# Patient Record
Sex: Male | Born: 1935 | Race: White | Hispanic: No | Smoking: Never smoker
Health system: Southern US, Community
[De-identification: ages and names within clinical notes are randomized; demographics above are authoritative.]

## PROBLEM LIST (undated history)

## (undated) DIAGNOSIS — I219 Acute myocardial infarction, unspecified: Secondary | ICD-10-CM

## (undated) DIAGNOSIS — I499 Cardiac arrhythmia, unspecified: Secondary | ICD-10-CM

## (undated) DIAGNOSIS — I10 Essential (primary) hypertension: Secondary | ICD-10-CM

## (undated) DIAGNOSIS — I2510 Atherosclerotic heart disease of native coronary artery without angina pectoris: Secondary | ICD-10-CM

## (undated) DIAGNOSIS — N183 Chronic kidney disease, stage 3 unspecified: Secondary | ICD-10-CM

## (undated) DIAGNOSIS — Z5189 Encounter for other specified aftercare: Secondary | ICD-10-CM

## (undated) DIAGNOSIS — J449 Chronic obstructive pulmonary disease, unspecified: Secondary | ICD-10-CM

## (undated) DIAGNOSIS — E039 Hypothyroidism, unspecified: Secondary | ICD-10-CM

## (undated) DIAGNOSIS — F32A Depression, unspecified: Secondary | ICD-10-CM

## (undated) DIAGNOSIS — IMO0002 Reserved for concepts with insufficient information to code with codable children: Secondary | ICD-10-CM

## (undated) DIAGNOSIS — I509 Heart failure, unspecified: Secondary | ICD-10-CM

## (undated) DIAGNOSIS — E669 Obesity, unspecified: Secondary | ICD-10-CM

## (undated) DIAGNOSIS — F329 Major depressive disorder, single episode, unspecified: Secondary | ICD-10-CM

## (undated) DIAGNOSIS — G4733 Obstructive sleep apnea (adult) (pediatric): Secondary | ICD-10-CM

## (undated) DIAGNOSIS — G4736 Sleep related hypoventilation in conditions classified elsewhere: Secondary | ICD-10-CM

## (undated) DIAGNOSIS — M1990 Unspecified osteoarthritis, unspecified site: Secondary | ICD-10-CM

## (undated) DIAGNOSIS — IMO0001 Reserved for inherently not codable concepts without codable children: Secondary | ICD-10-CM

## (undated) HISTORY — PX: JOINT REPLACEMENT: SHX530

## (undated) HISTORY — PX: KNEE SURGERY: SHX244

## (undated) HISTORY — DX: Atherosclerotic heart disease of native coronary artery without angina pectoris: I25.10

## (undated) HISTORY — PX: EYE SURGERY: SHX253

## (undated) HISTORY — PX: BACK SURGERY: SHX140

## (undated) HISTORY — DX: Essential (primary) hypertension: I10

## (undated) HISTORY — PX: CORONARY ARTERY BYPASS GRAFT: SHX141

## (undated) HISTORY — DX: Reserved for inherently not codable concepts without codable children: IMO0001

## (undated) HISTORY — PX: CARDIAC SURGERY: SHX584

## (undated) HISTORY — DX: Obesity, unspecified: E66.9

## (undated) HISTORY — PX: OTHER SURGICAL HISTORY: SHX169

## (undated) HISTORY — DX: Acute myocardial infarction, unspecified: I21.9

## (undated) HISTORY — DX: Unspecified osteoarthritis, unspecified site: M19.90

## (undated) HISTORY — PX: CAROTID ENDARTERECTOMY: SUR193

## (undated) HISTORY — DX: Chronic kidney disease, stage 3 (moderate): N18.3

## (undated) HISTORY — DX: Chronic kidney disease, stage 3 unspecified: N18.30

## (undated) HISTORY — DX: Chronic obstructive pulmonary disease, unspecified: J44.9

## (undated) HISTORY — DX: Heart failure, unspecified: I50.9

## (undated) HISTORY — DX: Encounter for other specified aftercare: Z51.89

---

## 1997-12-24 ENCOUNTER — Ambulatory Visit (HOSPITAL_COMMUNITY): Admission: RE | Admit: 1997-12-24 | Discharge: 1997-12-24 | Payer: Self-pay | Admitting: Internal Medicine

## 1998-11-14 ENCOUNTER — Inpatient Hospital Stay (HOSPITAL_COMMUNITY): Admission: AD | Admit: 1998-11-14 | Discharge: 1998-11-17 | Payer: Self-pay | Admitting: Internal Medicine

## 1998-11-15 ENCOUNTER — Encounter: Payer: Self-pay | Admitting: Cardiovascular Disease

## 1998-11-17 ENCOUNTER — Encounter: Payer: Self-pay | Admitting: Internal Medicine

## 1999-02-02 ENCOUNTER — Inpatient Hospital Stay (HOSPITAL_COMMUNITY): Admission: AD | Admit: 1999-02-02 | Discharge: 1999-02-05 | Payer: Self-pay | Admitting: Cardiovascular Disease

## 1999-04-20 ENCOUNTER — Inpatient Hospital Stay (HOSPITAL_COMMUNITY): Admission: EM | Admit: 1999-04-20 | Discharge: 1999-04-22 | Payer: Self-pay | Admitting: Cardiovascular Disease

## 1999-04-21 ENCOUNTER — Encounter: Payer: Self-pay | Admitting: Cardiovascular Disease

## 1999-04-23 ENCOUNTER — Inpatient Hospital Stay (HOSPITAL_COMMUNITY): Admission: AD | Admit: 1999-04-23 | Discharge: 1999-04-26 | Payer: Self-pay | Admitting: Internal Medicine

## 1999-08-15 ENCOUNTER — Inpatient Hospital Stay (HOSPITAL_COMMUNITY): Admission: AD | Admit: 1999-08-15 | Discharge: 1999-08-17 | Payer: Self-pay | Admitting: Cardiovascular Disease

## 2000-02-29 ENCOUNTER — Ambulatory Visit (HOSPITAL_BASED_OUTPATIENT_CLINIC_OR_DEPARTMENT_OTHER): Admission: RE | Admit: 2000-02-29 | Discharge: 2000-02-29 | Payer: Self-pay | Admitting: Internal Medicine

## 2000-04-03 ENCOUNTER — Ambulatory Visit: Admission: RE | Admit: 2000-04-03 | Discharge: 2000-04-03 | Payer: Self-pay | Admitting: Pulmonary Disease

## 2000-04-10 ENCOUNTER — Encounter: Admission: RE | Admit: 2000-04-10 | Discharge: 2000-07-09 | Payer: Self-pay | Admitting: Pulmonary Disease

## 2000-05-29 ENCOUNTER — Ambulatory Visit (HOSPITAL_COMMUNITY): Admission: RE | Admit: 2000-05-29 | Discharge: 2000-05-29 | Payer: Self-pay | Admitting: Cardiovascular Disease

## 2000-06-01 ENCOUNTER — Encounter: Payer: Self-pay | Admitting: Cardiovascular Disease

## 2000-06-01 ENCOUNTER — Ambulatory Visit (HOSPITAL_COMMUNITY): Admission: RE | Admit: 2000-06-01 | Discharge: 2000-06-01 | Payer: Self-pay | Admitting: Cardiovascular Disease

## 2001-09-03 ENCOUNTER — Ambulatory Visit (HOSPITAL_COMMUNITY): Admission: RE | Admit: 2001-09-03 | Discharge: 2001-09-03 | Payer: Self-pay | Admitting: Gastroenterology

## 2001-09-03 ENCOUNTER — Encounter (INDEPENDENT_AMBULATORY_CARE_PROVIDER_SITE_OTHER): Payer: Self-pay | Admitting: Specialist

## 2001-11-06 ENCOUNTER — Inpatient Hospital Stay (HOSPITAL_COMMUNITY): Admission: AD | Admit: 2001-11-06 | Discharge: 2001-11-13 | Payer: Self-pay | Admitting: Internal Medicine

## 2001-11-06 ENCOUNTER — Encounter: Payer: Self-pay | Admitting: Internal Medicine

## 2001-11-08 ENCOUNTER — Encounter: Payer: Self-pay | Admitting: Internal Medicine

## 2001-11-15 ENCOUNTER — Other Ambulatory Visit (HOSPITAL_COMMUNITY): Admission: RE | Admit: 2001-11-15 | Discharge: 2001-11-22 | Payer: Self-pay | Admitting: *Deleted

## 2002-02-17 ENCOUNTER — Encounter: Admission: RE | Admit: 2002-02-17 | Discharge: 2002-02-17 | Payer: Self-pay | Admitting: *Deleted

## 2002-04-21 ENCOUNTER — Encounter: Admission: RE | Admit: 2002-04-21 | Discharge: 2002-04-21 | Payer: Self-pay | Admitting: *Deleted

## 2002-08-06 ENCOUNTER — Encounter: Payer: Self-pay | Admitting: Internal Medicine

## 2002-08-06 ENCOUNTER — Inpatient Hospital Stay (HOSPITAL_COMMUNITY): Admission: AD | Admit: 2002-08-06 | Discharge: 2002-08-11 | Payer: Self-pay | Admitting: Internal Medicine

## 2002-08-07 ENCOUNTER — Encounter (INDEPENDENT_AMBULATORY_CARE_PROVIDER_SITE_OTHER): Payer: Self-pay | Admitting: Cardiovascular Disease

## 2002-08-08 ENCOUNTER — Encounter: Payer: Self-pay | Admitting: Cardiovascular Disease

## 2002-10-13 ENCOUNTER — Encounter: Admission: RE | Admit: 2002-10-13 | Discharge: 2002-10-13 | Payer: Self-pay | Admitting: *Deleted

## 2003-01-20 ENCOUNTER — Encounter: Admission: RE | Admit: 2003-01-20 | Discharge: 2003-01-20 | Payer: Self-pay | Admitting: Psychiatry

## 2004-03-14 ENCOUNTER — Ambulatory Visit (HOSPITAL_COMMUNITY): Payer: Self-pay | Admitting: Psychiatry

## 2004-03-17 ENCOUNTER — Inpatient Hospital Stay (HOSPITAL_COMMUNITY): Admission: AD | Admit: 2004-03-17 | Discharge: 2004-03-19 | Payer: Self-pay | Admitting: Cardiovascular Disease

## 2005-02-26 ENCOUNTER — Inpatient Hospital Stay (HOSPITAL_COMMUNITY): Admission: EM | Admit: 2005-02-26 | Discharge: 2005-03-08 | Payer: Self-pay | Admitting: Emergency Medicine

## 2006-12-31 ENCOUNTER — Encounter (HOSPITAL_COMMUNITY): Admission: RE | Admit: 2006-12-31 | Discharge: 2007-01-01 | Payer: Self-pay | Admitting: Cardiovascular Disease

## 2007-01-04 ENCOUNTER — Ambulatory Visit (HOSPITAL_COMMUNITY): Admission: RE | Admit: 2007-01-04 | Discharge: 2007-01-04 | Payer: Self-pay | Admitting: Cardiovascular Disease

## 2007-02-04 ENCOUNTER — Ambulatory Visit (HOSPITAL_COMMUNITY): Admission: RE | Admit: 2007-02-04 | Discharge: 2007-02-04 | Payer: Self-pay | Admitting: Internal Medicine

## 2007-03-14 ENCOUNTER — Inpatient Hospital Stay (HOSPITAL_COMMUNITY): Admission: AD | Admit: 2007-03-14 | Discharge: 2007-03-20 | Payer: Self-pay | Admitting: Internal Medicine

## 2007-03-16 ENCOUNTER — Encounter (INDEPENDENT_AMBULATORY_CARE_PROVIDER_SITE_OTHER): Payer: Self-pay | Admitting: Cardiovascular Disease

## 2007-03-16 ENCOUNTER — Ambulatory Visit: Payer: Self-pay | Admitting: Vascular Surgery

## 2008-08-17 ENCOUNTER — Ambulatory Visit: Payer: Self-pay | Admitting: Cardiothoracic Surgery

## 2008-10-05 ENCOUNTER — Ambulatory Visit: Payer: Self-pay | Admitting: Thoracic Surgery (Cardiothoracic Vascular Surgery)

## 2010-09-22 ENCOUNTER — Encounter: Payer: Self-pay | Admitting: Internal Medicine

## 2010-10-25 NOTE — Cardiovascular Report (Signed)
[name], [name]               ACCOUNT NO.:  [id number]   MEDICAL RECORD NO.:  [id number]          PATIENT TYPE:  OIB   LOCATION:  2899                         FACILITY:  [facility]   PHYSICIAN:  Ricki Rodriguez, M.D.  DATE OF BIRTH:  05-Jun-1936   DATE OF PROCEDURE:  01/04/2007  DATE OF DISCHARGE:                            CARDIAC CATHETERIZATION   PROCEDURE:  Left heart catheterization and selective coronary  angiography.   INDICATIONS:  This 75 year old white male with known coronary artery  disease, hypertension, diabetes and hyperlipidemia had abnormal nuclear  stress test.   APPROACH:  Right femoral artery using a 4-French sheath upgraded to 5-  French sheath for the right coronary artery visualization including  using a no-torque right Judkins catheter and heavy-duty wire for  catheter support.   HEMODYNAMIC DATA:  The left ventricular pressure was 174/85 and the  aortic pressure was 174/20.   CORONARY ANATOMY:  1. The left main coronary artery was unremarkable; it had significant      calcification.  2. Left anterior descending coronary artery:  The left anterior      descending coronary artery also had significant calcification in      the proximal half of the vessel and had several 20% lesions with      luminal irregularities throughout and had an in-stent 50% eccentric      lesion and 20% to 30% lesions throughout the stented area.  The      distal vessel was unremarkable.  The diagonal vessel had a diffuse      narrowing of both diagonal #1 and #2.  3. Left circumflex coronary artery:  The left circumflex coronary      artery had diffuse narrowing of the entire vessel with mild ostial      disease.  The obtuse marginal branch was a large vessel and      proximal 1/4 had 20% to 30% lesions.  The mid and distal area was      unremarkable.  4. Right coronary artery:  The coronary artery was dominant, had      proximal 40% eccentric lesion, mid-vessel luminal  irregularities      and distal luminal irregularities.  The posterior descending      coronary artery and posterolateral branch were unremarkable.   IMPRESSION:  1. Mild to moderate multivessel native vessel coronary artery disease.  2. Moderate in-stent stenosis in left anterior descending.  3. Preserved left ventricular systolic function by echocardiogram.   RECOMMENDATIONS:  This patient is cleared for his left knee surgery and  will continue his medical therapy.      Ricki Rodriguez, M.D.  Electronically Signed     ASK/MEDQ  D:  01/04/2007  T:  01/05/2007  Job:  [id number]
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
## 2010-10-28 NOTE — Procedures (Signed)
[facility]  Patient:    [name], BANKE Visit Number: [id number] MRN: [id number]          Service Type: END Location: ENDO Attending Physician:  Orland Mustard Dictated by:   Llana Aliment. Randa Evens, M.D. Proc. Date: 09/03/01 Admit Date:  09/03/2001   CC:         Eric L. August Saucer, M.D.   Procedure Report  DATE OF BIRTH:  1936-02-24.  PROCEDURE:  Colonoscopy and polypectomy.  MEDICATIONS:  Fentanyl 50 mcg, Versed 8 mg IV.  SCOPE:  Adult Olympus video colonoscope.  INDICATION:  Rectal bleeding.  DESCRIPTION OF PROCEDURE:  The procedure had been explained to the patient an consent obtained. With the patient in the left lateral decubitus position, the Olympus Adult video colonoscope was inserted and advanced under direct visualization. The prep was marginal, particularly in the right colon. The small polyp could have been missed. We were able to advance to just above the ileocecal valve. The scope was withdrawn and in the mid ascending colon, a 0.75-cm sessile polyp was encountered; it was removed with the snare and sucked through the scope. There was no bleeding. The remainder of the ascending colon, transverse, descending and sigmoid colon were seen well, scattered diverticula. The prep was adequate but some areas did have sticky adherent stool. The scope was withdrawn down to the rectum. The rectum was free of polyps. It did have large internal hemorrhoids in the anal canal seen upon withdrawal. The scope was withdrawn. The patient tolerated the procedure well. The patient was maintained on low flow oxygen and pulse oximeter throughout the procedure.  ASSESSMENT: 1. Ascending colon polyp. 2. Internal hemorrhoids.  PLAN:  Routine postpolypectomy instructions. Will recommend repeating procedure in three years. Dictated by:   Llana Aliment. Randa Evens, M.D. Attending Physician:  Orland Mustard DD:  09/03/01 TD:  09/04/01 Job: [id number]

## 2010-10-28 NOTE — Cardiovascular Report (Signed)
[place]. [facility]  Patient:    [name], [name]                        MRN: [id number] Proc. Date: 06/01/00 Adm. Date:  [id number] Attending:  Orpah Cobb S                        Cardiac Catheterization  PROCEDURE: Left heart catheterization, selective coronary angiography and left ventricular function study.  INDICATIONS: This 75 year old white male, with a known history of PTCA x 4 had recurrent chest pain and positive ischemia on stress test.  APPROACH: Right femoral artery using 6 French diagnostic catheters.  COMPLICATIONS: None.  HEMODYNAMIC DATA: The left ventricular pressure is 155/25 mmHg and the aortic pressure is 156/85 mmHg.  LEFT VENTRICULOGRAM: The left ventriculogram showed mild anterior wall hypokinesia with an ejection fraction of 55-60%.  CORONARY ANATOMY: Left main: The left main coronary artery was unremarkable.  Left left anterior descending: Left anterior descending coronary artery had proximal 20-30% stenosis.  There was no significant in-stent stenosis except for the distal portion of the stent and a post stent 60% eccentric stenosis with a good distal flow.  The diagonal vessels were small vessels.  Left circumflex: The left circumflex coronary artery had a very large ramus branch and the circumflex itself was very small with a 20% ostial narrowing.  Right coronary artery: The right coronary artery had minimal luminal irregularities, otherwise was unremarkable.  IMPRESSION: 1. Multivessel coronary artery disease. 2. Post stent left anterior descending coronary artery stenosis. 3. Preserved left ventricular systolic function.  RECOMMENDATIONS: This patient will have a maximum medical therapy.  He was strongly advised to decrease weight, decrease cholesterol level.  He may undergo nuclear stress test evaluation and if his symptoms are positive, he may undergo PTCA/stent of his left anterior descending coronary  artery. DD:  06/01/00 TD:  06/02/00 Job: [id number]

## 2010-10-28 NOTE — Discharge Summary (Signed)
[place]. [facility]  Patient:    [name], [name]                        MRN: [id number] Adm. Date:  [id number] Disc. Date: [id number] Attending:  Ricki Rodriguez                           Discharge Summary  REFERRING PHYSICIAN:  Dr. Willey Blade.  PRINCIPAL DIAGNOSES:  1. Intermediate coronary syndrome.  2. Multivessel coronary artery disease.  3. Hypertension.  4. Hyperlipidemia.  5. Asthma.  6. Anxiety.  PRINCIPAL PROCEDURES:  Left heart catheterization, selective coronary angiography, left ventricular function study, and PTCA of left anterior descending coronary artery, done by Dr. Orpah Cobb on August 15, 1999.  DISCHARGE MEDICATIONS:  1. Plavix 75 mg 1 p.o. daily.  2. Enteric-coated aspirin 325 mg daily.  3. Pravachol 20 mg daily.  4. Paxil 30 mg daily.  5. Allegra 60 mg twice daily as needed.  6. Cardizem 240 mg 1 daily.  7. Altace 5 mg daily.  8. Azmacort 2 puffs four times daily.  9. Serevent inhaler 2 puffs twice daily. 10. Albuterol inhaler 2 puffs four times daily. 11. Lopressor 50 mg 1/2 tablet twice daily.  ACTIVITY:  Sedentary for 24 hours, then resume activity.  DIET:  Low fat, low salt diet as tolerated.  The patient to decrease all types f food by 50%.  WOUND CARE:  The patient is to notify doctor if he has right groin pain, swelling, or discharge.  FOLLOW-UP:  Follow-up appointment with Dr. Orpah Cobb in two weeks, patient to call [phone].  By Dr. Willey Blade as arranged.  CONDITION ON DISCHARGE:  Improved.  HISTORY OF PRESENT ILLNESS:  This 75 year old white male with a recent history f recurrent chest pain with activity has a known history of coronary artery disease, hypertension, obesity, elevated cholesterol level.  PHYSICAL EXAMINATION:  VITAL SIGNS:  Temperature 97.5, pulse 72, respirations 24, blood pressure 148/64. Height 6 feet 1 inch, weight 299 pounds.  GENERAL:  Alert and oriented x 3.  HEENT:  Head:   Normocephalic, atraumatic.  Eyes:  Hazel eyes, with pupils equally reacting to light.  Extraocular movements intact.  Conjunctivae pink, sclerae nonicteric.  Ears, nose, throat:  Pink mucous membranes with some yellowing of he teeth.  NECK:  Supple.  Without thyroid enlargement, and no carotid bruit.  LUNGS:  Clear bilaterally, with occasional forced expiratory wheeze.  HEART:  Normal S1, S2.  No murmur, gallop, or rub.  ABDOMEN:  Distended, but nontender.  Bowel sounds present.  EXTREMITIES:  Full range of motion without edema, cyanosis, or clubbing.  CNS:  Cranial nerves II-XII grossly intact.  LABORATORY DATA:  Normal WBC count, hemoglobin, hematocrit, and platelet count.  Normal electrolytes, BUN, and creatinine.  Normal PT and PTT.  EKG showed sinus bradycardia with a nonspecific T-wave abnormality.  Cardiac catheterization showed pre-stent 50-60% stenosis and post-stent 80% stenosis, which was treated with a 2.5 mm x 15 mm CrossSail balloon, 90% stenosis was reduced to 10%, with a TIMI-3 flow.  HOSPITAL COURSE:  The patient was admitted to telemetry unit.  Myocardial infarction was ruled out.  He underwent cardiac catheterization that showed significant left anterior descending coronary artery disease but pre- and post-stent area, which was treated with 2.5 mm x 15 mm CrossSail balloon.  The patient had no postprocedure complications, and had excellent result.  He was kept an addition 24 hours for IV heparin use, and was discharged home in satisfactory condition, with instructions to reduce food intake and control his ______ risk factors, and follow up by me in two weeks.  DD:  08/17/99 TD:  08/18/99 Job: [id number]

## 2010-10-28 NOTE — Cardiovascular Report (Signed)
[place]. [facility]  Patient:    [name], [name]                        MRN: [id number] Proc. Date: 08/15/99 Adm. Date:  [id number] Disc. Date: [id number] Attending:  Ricki Rodriguez CC:         Lind Guest. August Saucer, M.D.                        Cardiac Catheterization  REFERRING PHYSICIAN:  Eric L. August Saucer, M.D.  CINE NO.:  [id number]  PROCEDURES: 1. Left heart catheterization. 2. Selective coronary angiography. 3. Left ventricular function study. 4. PTCA of the LAD.  INDICATIONS:  This 75 year old white male had recurrent chest pain, with a known coronary artery disease and stent placement in the LAD.  COMPLICATIONS:  None.  APPROACH:  Right femoral artery using 6-French diagnostic catheters.  HEMODYNAMIC DATA: 1. Left ventricular pressure:  156/26 mmHg. 2. Aortic pressure:  153/65 mmHg.  LEFT VENTRICULOGRAM:  Hyperdynamic with mild anterior wall hypokinesia. Ejection fraction was 75%.  CORONARY ANATOMY: 1. Left Main Coronary Artery:  Unremarkable. 2. Left Anterior Descending:  Had proximal 50%, in-stent 50% and post-stent 80%  stenoses.  The second and third diagonal branches were very small vessels.    The first diagonal branch had an ostial 50% stenosis and was running medially. 3. Left Circumflex Coronary Artery:  Had mild proximal disease.  The ramus branch    and large obtuse marginal branch were unremarkable. 4. Right Coronary Artery:  Had proximal 20%, mild diffuse narrowing of the mid nd    distal vessel.  The posterolateral and posterior descending coronary arteries    were unremarkable.  PTCA/STENT PROCEDURE:  PTCA of left anterior descending coronary artery was carried out using a JL4 6-French guide, 190 cm Hi-Torque Floppy wire, a 2.5 mm x 15 mm CrossSail balloon inflated at 12 atm for 30 sec x 2.  This was followed by 12 atm for 50 sec in the mid LAD area, 12 atm for 35 sec in the proximal LAD area, no t 16 atm for 32 sec within the  stent area.   The 90% stenosis was reduced to 10% ith TIMI-3 flow.  IMPRESSION: 1. Severe left anterior descending coronary artery disease. 2. Hypertensive heart disease. 3. Successful PTCA of LAD.  RECOMMENDATIONS:  The patient will be treated with IV heparin and nitroglycerin. He will be admitted to the elective angioplasty unit for postangioplasty care. DD:  09/07/99 TD:  09/08/99 Job: [id number]

## 2010-10-28 NOTE — Discharge Summary (Signed)
NAME:  [name], [name]                         ACCOUNT NO.:  [id number]   MEDICAL RECORD NO.:  [id number]                   PATIENT TYPE:  INP   LOCATION:  0383                                 FACILITY:  [facility]   PHYSICIAN:  Eric L. August Saucer, M.D.                  DATE OF BIRTH:  02-22-1936   DATE OF ADMISSION:  08/06/2002  DATE OF DISCHARGE:  08/11/2002                                 DISCHARGE SUMMARY   FINAL DIAGNOSES:  1. Acute asthmatic bronchitis.  2. Chronic obstructive pulmonary disease with exacerbation.  3. Coronary artery disease.  4. Hypertension with mild exacerbation.  5. Venous insufficiency.  6. Diabetes mellitus.  7. Sleep apnea.  8. Depression.  9. Degenerative joint disease.  10.      Obesity.   PROCEDURES:  1. Adenosine/Cardiolite stress test per Dr. Algie Coffer, which was negative     except for hypertensive response.  2. A 2D echocardiogram which demonstrated a left ventricular ejection     fraction of 40-50% which is mildly decreased.   HISTORY OF PRESENT ILLNESS:  This was the first recent [facility]  admission for this  75 year old divorced white male with a longstanding  history of COPD, diabetes mellitus, and coronary artery disease.  He had  been doing only moderately well over the past few weeks.  The patient noted  increasing exertional dyspnea.  This was not associated with chest pain or  substernal pressure or diaphoresis.  For the past few days prior to  admission, he had a marked increase in dyspnea.  The morning of admission,  he noted more swelling in his lower extremities with increasing shortness of  breath.  The patient found an old fluid pill which he took but did not have  any improvement.  He was subsequently brought in for further evaluation and  therapy.   PAST MEDICAL HISTORY AND PHYSICAL EXAMINATION:  Per admission H&P.  It is  notable for him having coronary artery disease with him being status post  angioplasty x2.  He had  been followed by Dr. Algie Coffer.  The patient also has  sleep apnea for which he uses a CPAP machine on a regular basis.  Past  medical history and physical examination are, otherwise, as noted per H&P.   HOSPITAL COURSE:  The patient was admitted for further treatment of  progressive dyspnea with multiple factors being involved.  He was placed on  telemetry initially associated arrhythmias.  He was started on intensive  pulmonary therapy with DuoNeb as well as O2 support and antibiotics.  He was  started on a mild diuretic in view of his blood pressure being elevated as  well as lower extremity edema.  Over the subsequent days, he made  considerable progress with diuresis.  The patient was seen by Dr. Algie Coffer,  his cardiologist.   The question of his progression of dyspnea raised the  impression of  underlying worsening coronary artery disease.  A 2D echocardiogram was done  which showed some decreased LV function with ejection fraction of 40-50%.  The patient subsequently underwent an adenosine/Cardiolite study which  showed no evidence for ischemia.  He did have the hypertensive response, as  noted.  His medication was subsequently adjusted thereafter.   With the intensive pulmonary regimen as well as treating his hypertension,  the patient did make considerable progress.  His exertional dyspnea did  improve.  His dietary habits were reviewed again.  Note, that his A1c was  6.3 which was felt to be excellent.  The patient was seen in followup again  by Dr. Jeanie Sewer of psychiatry, who had seen him in the past.  His  psychotropic medication was adjusted as it was felt that he was still  dealing with major depression which was recurrent in nature.   It should also be noted that the patient did have a CT scan of his chest in  lieu of his shortness of breath.  He was noted to have some scarring at the  bases with some hilar adenopathy.  No definite masses were seen, however.  This will be  followed upon as an outpatient.   By August 11, 2002, he was feeling considerably better.  He was much more  ambulatory without significant exertional dyspnea.  Efforts for weight  reduction were discussed, as his weight at the time of discharge was 323.  This, however, did represent a 12 pound decrease from his admission weight  of 335.   DISCHARGE MEDICATIONS:  1. Triamterene/hydrochlorothiazide 25 mg p.o. daily.  2. Claritin 10 mg p.o. daily.  3. DuoNeb nebulizers 3 mL via handheld nebulizer q.i.d.  4. Flomax 0.4 mg at bedtime.  5. Celexa 40 mg daily.  6. Avapro 300 mg daily.  7. Amaryl 6 mg p.o. q.a.m.  8. Tiazac 420 mg p.o. q.a.m.  9. Plavix 75 mg daily.  10.      Coreg 3.125 mg b.i.d.  11.      Humibid DM one b.i.d.  12.      Wellbutrin SR 100 mg daily.   ACTIVITY:  As tolerated.   DIET:  He will be maintained on a low-carbohydrate modified diet.   FOLLOWUP:  Follow up in my office in two weeks' time.  He needs further  psychiatric outpatient followup as well.                                               Eric L. August Saucer, M.D.    ELD/MEDQ  D:  09/10/2002  T:  09/10/2002  Job:  [id number]

## 2010-10-28 NOTE — Discharge Summary (Signed)
[place]. [facility]  Patient:    [name], [name] Visit Number: [id number] MRN: [id number]          Service Type: PSY Location: [facility] Attending Physician:  Denny Peon Dictated by:   Lind Guest. August Saucer, M.D. Admit Date:  11/15/2001 Discharge Date: 11/22/2001                             Discharge Summary  FINAL DIAGNOSES:  1. Type 2 diabetes mellitus, non-insulin dependent, new onset, 252.02.  2. Chronic obstructive asthma, 493.20.  3. Morbid obesity, 278.01.  4. General osteoarthrosis, 715.09.  5. Bursitis of right shoulder, 727.3.  6. 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  7. Hyperlipidemia, 272.4.  8. Chronic sinusitis, 473.9.  9. Sleep apnea, 780.57. 10. Coronary atherosclerosis with native coronary vessels, 414.01. 11. Percutaneous transluminal coronary angioplasty status, 345.82.  OPERATIONS AND PROCEDURES:  Injection of the right shoulder per Dr. Cleophas Dunker.  HISTORY OF PRESENT ILLNESS:  This was one of several [facility] admissions for this 75 year old divorced white male with multiple medical problems, including chronic obstructive pulmonary disease, coronary artery disease, morbid obesity, and degenerative joint disease.  Over the past several weeks prior to admission he had noted increased urinary frequency, as well as increasing thirst and craving for sweets.  This had been associated with occasional incontinence as well of his urine.  He had recently checked his blood sugar at a friends house, and was noted to have a blood sugar of 460.  The patient did not call the office until later.  He is subsequently admitted at this time for further evaluation and therapy.  PAST MEDICAL HISTORY:  Per admission H&P.  PHYSICAL EXAMINATION:  Per admission H&P.  HOSPITAL COURSE:  The patient was admitted for further treatment of new onset of diabetes mellitus.  There was some mild dehydration as well.  He was noted to have ongoing  problems with morbid obesity and noncompliance.  The patient was started on IV fluids at 1/2 normal saline at 50 cc an hour for mild volume replacement.  He was started on a sliding scale insulin regimen per hospital protocol.  He was also placed on Amaryl in lieu of the medications such as Glucophage.  He was started on intensive diabetic education as well.  He was also screened for a cold infection.  Chest x-ray was stable. Urinalysis showed no significant bacteria.  A hemoglobin A1C was obtained which was 11.8, indicative of poor control for several months.  Over the subsequent days, he made steady progress with diabetic education, as well as improvement of his blood sugars.  He was also instructed on the use of insulin injection, which he eventually did quite well.  Notably while in the hospital, he also complained of severe right shoulder pain.  X-ray of the shoulder demonstrated osteoarthritis with an inferior spur formation of the humeral head.  He was seen in consultation by Dr. Cleophas Dunker and underwent a steroid injection which he tolerated well and was quite effective.  The patient also notably during this time of hospitalization was noted to be significantly depressed.  This had been a recurring problem for several months to years.  He was seen in consultation by Dr. Jeanie Sewer.  It was agreed upon that he did have significant depression.  His medications were adjusted thereafter.  With continued therapy, the patients blood sugars eventually did decrease to the point of being acceptable for discharge.  He  was advised to set up for outpatient management and follow up at the nutrition management center.  He was also referred to intensive outpatient management for his depression.  Notably during his hospital stay, he also had exertional dyspnea.  A 2-D echocardiogram was obtained which demonstrated low-normal left ventricular function without any other significant  abnormalities.  By 11/13/01, he was felt to be much better, and felt to be stable for discharge.  DISCHARGE MEDICATIONS:  1. Celexa 40 mg p.o. q.d.  2. Flomax 0.4 mg q.d.  3. Claritin 10 mg q.d.  4. Avapro 300 mg q.d.  5. Amaryl 6 mg q.a.m.  6. Glucophage 500 mg two with dinner.  7. Tessalon pearls q.8h. p.r.n. cough.  8. Advair 100/50 one puff b.i.d.  9. Plavix 75 mg p.o. q.d. 10. Cardizem 300 mg p.o. q.d. 11. Lopressor 25 mg b.i.d. 12. Wellbutrin SR 100 mg q.d. 13. A sliding scale insulin regimen.  DIET:  A 4 g sodium, 1600 calorie, ADA diet.  For CBGs from 251-300= 12 units, 201-258= 8 units, 151-200= 4 units.  FOLLOWUP:  In the office in two weeks.  Further therapy thereafter. Dictated by:   Lind Guest. August Saucer, M.D. Attending Physician:  Denny Peon DD:  12/25/01 TD:  12/30/01 Job: [id number]

## 2010-10-28 NOTE — H&P (Signed)
[place]. [facility]  Patient:    [name], [name] Visit Number: [id number] MRN: [id number]          Service Type: MED Location: 3000 [id number] Attending Physician:  Gwenyth Bender Dictated by:   Lind Guest. August Saucer, M.D. Admit Date:  11/06/2001                           History and Physical  CHIEF COMPLAINT:  Increasing weakness, urinary frequency, elevated blood sugar.  HISTORY OF PRESENT ILLNESS:  This is one of several [facility] admissions for this 75 year old divorced white male who has multiple medical problems including COPD, coronary artery disease, morbid obesity and degenerative joint disease.  He, over the past several weeks, had noted increasing problems with urinary frequency.  This has been associated with increasing thirst and craving for sweets as well, states that he has noted incontinence of his urine on several occasions over the past week and a half as well.  He had his blood sugar checked recently at a friends house; he was noted to have a blood sugar of 460.  The patient did not call the office until today.  He was advised to be admitted at that time.  He notably has a history of having a mild elevation in his blood sugar as of last year, with the blood sugar being 150.  He was advised at that time to modify his diet and eating habits, as well as weight reduction; this unfortunately has not been the case. The patient did not obtain his most recent lab work in April of this year as well.  The patient denies chest pains.  No increased shortness of breath.  He has not had lightheadedness or dizziness.  Family, however, reports the patient did experience one episode of presyncope; this was approximately two months ago.  PAST MEDICAL HISTORY:  Past medical history is remarkable for coronary artery disease with multiple angioplasties per Dr. Ricki Rodriguez.  He has a longstanding history of COPD and asthma, longstanding morbid obesity  with severe degenerative joint disease of the knees bilaterally.  The patient has also suffered from depression for several years.  This has affected his ability to comply with his medical regimen on a recurrent basis.  Past medical history otherwise remarkable for intermittent rectal bleeding. He was due to a colonoscopy as well; this is due to be performed the first of June.  His history is also significant for significant sleep apnea; he does use a CPAP machine on a regular basis.  REVIEW OF SYSTEMS:  Review of systems as noted above.  He has had problems with BPH symptoms in the past which improved with Flomax.  The patient does not smoke or drink.  Review of systems is otherwise unremarkable.  ALLERGIES:  SULFA.  PRESENT MEDICATIONS:  1. Celexa 40 mg p.o. q.d.  2. Flomax 0.4 mg q.d.  3. Allegra 180 mg q.d.  4. Metoprolol 25 mg q.d.  5. Multivitamins q.d.  6. Advil Gelcaps one b.i.d.  7. Enteric-coated aspirin 81 mg p.o. q.d.  8. Diovan HCT 160 mg q.d.  9. Cartia 240 mg q.d. 10. Plavix 75 mg p.o. q.d. 11. Glucosamine one q.d. 12. Fibercon one dose q.d. 13. Saline nasal spray q.d. 14. Advair 100/50 one puff b.i.d.  SOCIAL HISTORY:  The patient is divorced, presently lives with his mother, for which he assumes primary care as well.  He has been  disabled for the past five years.  FAMILY HISTORY:  Family history is remarkable for hypertension in his mother. He had a maternal grandfather with diabetes mellitus.  PHYSICAL EXAMINATION:  GENERAL:  He is an obese white male in no acute distress.  VITAL SIGNS:  Vital signs reveal a blood pressure of 144/70, pulse of 78, respiratory rate 20, temperature 98.4, O2 saturation of 96% on room air.  HEENT:  Head normocephalic, atraumatic, without bruits.  Extraocular muscles are intact.  Fundi grade 1.  Nose:  Mild turbinate edema without occlusions. TMs with decreased light reflex bilaterally.  Throat:  Fair  dentition. Posterior pharynx is clear.  NECK:  Supple.  No enlarged thyroid.  No posterior cervical nodes.  LUNGS:  Distant breath sounds without wheezes or rales.  No E-to-A changes. No CVA tenderness.  CARDIOVASCULAR:  He has a normal S1, normal S2, no S3, no S4, 1/6 systolic ejection murmur at lower left sternal border.  No rub.  ABDOMEN:  Bowel sounds are present.  Abdomen is obese.  No enlarged liver or spleen, masses or tenderness.  GU:  Normal male with bilaterally descended testes.  MUSCULOSKELETAL:  He has tenderness in the right AC joint to direct palpation. Mild crepitation to passive range of motion.  Knees are notable for tenderness in the medial aspects bilaterally.  Crepitation to passive range of motion. No increased warmth.  Negative Homans.  No edema.  NEUROLOGIC:  Alert and oriented x3.  Cranial nerves were intact.  Negative drift.  PSYCHIATRIC:  Patient with occasionally inappropriate affect, acknowledges some depression, mood otherwise appropriate.  SKIN:  Skin notable for scattered psoriatic nodules on the skin.  Skin is exceptionally dry as well.  No abscess or areas of cellulitis appreciated.  LABORATORY AND ACCESSORY DATA:  Laboratory data pending.  Initial blood sugar was 402.  Other lab studies pending at this time.  IMPRESSION:  1. Diabetes mellitus, new onset.  The patient appears to have history of mild     glucose intolerance, which apparently has progressed.  2. Morbid obesity with poor dietary habits.  3. Coronary artery disease, presently stable.  4. Chronic obstructive pulmonary disease with intermittent asthma without     recent exacerbation.  5. Degenerative joint disease.  6. Depression.  7. Sleep apnea.  8. Questionable history of presyncope.  PLAN:  The patient is admitted for further control of new-onset diabetes.  We will place him on half normal saline at 50 cc/hr for mild volume replacement, start insulin and moderately  sensitive at this time.  He will be a good  candidate for Amaryl and due to his size, we will start this at 4 mg p.o. q.d. Intensive diabetic education as well as instruction on the use of glucometer. Chest x-ray pending.  Further therapy pending results of the above.  We will screen for occult infection as well. Dictated by:   Lind Guest. August Saucer, M.D. Attending Physician:  Gwenyth Bender DD:  11/06/01 TD:  11/08/01 Job: [id number]

## 2010-10-28 NOTE — Cardiovascular Report (Signed)
NAMEJOSHAWA, [name]               ACCOUNT NO.:  [id number]   MEDICAL RECORD NO.:  [id number]          PATIENT TYPE:  INP   LOCATION:  3708                         FACILITY:  [facility]   PHYSICIAN:  Ricki Rodriguez, M.D.  DATE OF BIRTH:  [birth year]   DATE OF PROCEDURE:  03/18/2004  DATE OF DISCHARGE:                              CARDIAC CATHETERIZATION   PROCEDURE:  Left heart catheterization, selective coronary angiography, left  ventricular  function study.   INDICATIONS FOR PROCEDURE:  This 75 year old white male with a known history  of coronary artery disease and stent placement in LAD had recurrent chest  pain along with cardiac risk factors of diabetes, hypertension,  hyperlipidemia, obesity and family history of premature coronary artery  disease.   APPROACH:  Right femoral artery using 5 French sheath and catheters.   COMPLICATIONS:  None.   HEMODYNAMIC DATA:  The left ventricular pressure was 145 x 14 x 21.  Aortic  pressure was 143/78.   Left ventriculogram:  The left ventriculogram showed a mild anterior wall  hypokinesia with ejection fraction of 50%.   CORONARY ANATOMY:  1.  Left main coronary artery:  The left main coronary artery was short.  2.  Left anterior descending coronary artery:  The left anterior descending      coronary artery had ostial 20% to proximal 20%, end-stage 20% throughout      and the post stent 30% stenosis.  The rest of the vessel was      unremarkable.  The diagonal I and II were very small vessels.  3.  Left circumflex coronary artery:  The left circumflex coronary artery      was essentially unremarkable except for luminal irregularities and had a      larger obtuse marginal branch which also had luminal irregularities in      its proximal portion.  4.  Right coronary artery:  The right coronary artery was dominant and had      luminal irregularities.  5.  Posterior descending coronary artery and posterolateral branches were  unremarkable.   IMPRESSION:  1.  Mild multivessel native vessel coronary artery disease.  2.  Patent left anterior descending coronary artery stent.  3.  Mild left ventricular systolic dysfunction.   RECOMMENDATIONS:  This patient will be treated medically.     ASK/MEDQ  D:  03/18/2004  T:  03/18/2004  Job:  [id number]

## 2010-10-28 NOTE — H&P (Signed)
NAME:  [name], NEIDIG                         ACCOUNT NO.:  [id number]   MEDICAL RECORD NO.:  [id number]                   PATIENT TYPE:  INP   LOCATION:  0343                                 [facility]   PHYSICIAN:  Eric L. August Saucer, M.D.                  DATE OF BIRTH:  [birth year]   DATE OF ADMISSION:  08/06/2002  DATE OF DISCHARGE:                                HISTORY & PHYSICAL   CHIEF COMPLAINT:  Progressive dyspnea on exertion.   HISTORY OF PRESENT ILLNESS:  First recent [facility] hospital admission for  this 75 year old divorced white male with longstanding history of COPD,  diabetes mellitus and coronary artery disease. He had been doing only  moderately well up until the past few weeks. He had been doing only  moderately well up until the past few weeks. He had noted mild exertion of  dyspnea. This was not associated with chest pain or substernal pressure or  diaphoresis. For the past few days, he has had marked increase in dyspnea.  This morning he noted more swelling in his lower extremities with increasing  shortness of breath. The patient states that he found an old Maxzide at  home. He did take this to help. He did pass some fluid and felt somewhat  better but still had significant exertion of dyspnea. The patient was  subsequently brought in for further evaluation.   PAST MEDICAL HISTORY:  Significant for coronary artery disease status post  angioplasty x2. This has been followed by Dr. Algie Coffer who apparently he has  not seen over the past year. The patient also has sleep apnea for which he  has been using a CPAP machine on a regular basis. History is significant for  morbid obesity as well.   The patient does not smoke or drink. He had worked for several years as a  [employer] and was exposed to exhaust fumes which by history injured his  lung. The patient also has had significant depression recently. This has  compounded his dietary compliance with  progressive weight gain.   History is significant for diabetes mellitus diagnosed within the past two  years. He acknowledges problems with dietary compliance. He has been  consistently checking his blood sugars.   REVIEW OF SYMPTOMS:  As noted above.   ALLERGIES:  SULFA.   MEDICATIONS:  1. Celexa 40 mg p.o. daily.  2. Flomax 0.4 mg daily.  3. Claritin 10 mg daily.  4. Avapro 300 mg daily.  5. Amaryl 6 mg q.a.m.  6. Glucophage 500 mg two p.o. with dinner meal.  7. Tessalon Perles one q. 4h p.r.n. cough (have not recently had).  8. Flovent 220 mcg one puff b.i.d. with Serevent inhaler one puff b.i.d.  9. Plavix 75 mg daily.  10.      Cardizem 300 mg daily.  11.      Lopressor 25 mg  p.o. b.i.d.  12.      Wellbutrin SR 100 mg daily.   PHYSICAL EXAMINATION:  GENERAL:  He is a well-developed, obese, white male  presently in no acute distress. He does have marked exertion of dyspnea on  ambulation.  VITAL SIGNS:  Reveal blood pressure of 160/90, pulse 94, respiratory rate 18  rising to 24 with mild exertional activity. Height and weight pending.  HEENT:  Head normocephalic, atraumatic without bruits. Extraocular movements  intact. Fundi grade 1. There is no sinus tenderness. Mild turbinate edema.  TMs with decreased light reflex bilaterally with mild cerumen. Throat fair  dentition, posterior pharynx is clear.  NECK:  Supple, no enlarged thyroid. No posterior cervical nodes.  LUNGS:  Distant breath sounds without wheezes or rales. No E to A changes  appreciated as well.  CARDIOVASCULAR:  He has a soft S4, normal S1, S2. No S3. He has 1/6 systolic  ejection murmur in the lower left sternal border.  ABDOMEN:  Obese. Bowel sounds are present. No masses or tenderness.  MUSCULOSKELETAL:  He has marked degenerative changes in the knees with  decreased range of motion. No joint tenderness appreciated. He has trace  pitting edema bilaterally, negative Homans. Trace dorsalis pedis pulses   bilaterally. Skin notable for scattered psoriatic plaques on his legs and  arms.  NEUROLOGIC:  Intact.  PSYCHIATRIC:  Flat affect with depressed mood.   LABORATORY DATA:  Pending. Telemetry shows sinus rhythm at this time.   IMPRESSION:  1. Progressive exertion on dyspnea, multifactorial enlarging.  2. Chronic obstructive pulmonary disease rule out progression. Rule out     atypical proctitis.  3. Coronary artery disease, rule out angina equivalent. The patient notably     did not have typical anginal symptoms with his previous ischemic events.  4. Diabetes mellitus.  5. Morbid obesity.  6. Sleep apnea.  7. Degenerative joint disease of the knees. This has limited his mobility in     the past and affected his  weight  loosing efforts.  1. Depression. Rule out bipolar illness.  2. History of allergic rhinitis.  3. Distant history of benign prostatic hypertrophy. This has presently been     controlled with Flomax.   PLAN:  He has been admitted to telemetry for further evaluation. Will  proceed with diuresis and institution of intensive pulmonary Nebulizer  regimen. Empiric antibiotics as well. Will monitor his blood sugars closely  with dietary follow-up. Will follow-up with 2-D echo for further evaluation  of LV function. Consideration for repeat stress Cardiolite. Will have  cardiology followup with the patient's progress as well. Will followup with  the patient in family services as well as dietary counseling to reinforce  weight losing efforts. __________ therapy thereafter.                                               Eric L. August Saucer, M.D.    ELD/MEDQ  D:  08/06/2002  T:  08/06/2002  Job:  [id number]

## 2010-10-28 NOTE — H&P (Signed)
[name], [name]               ACCOUNT NO.:  [id number]   MEDICAL RECORD NO.:  [id number]          PATIENT TYPE:  INP   LOCATION:  3708                         FACILITY:  [facility]   PHYSICIAN:  Ricki Rodriguez, M.D.  DATE OF BIRTH:  [birth year]   DATE OF ADMISSION:  03/17/2004  DATE OF DISCHARGE:                                HISTORY & PHYSICAL   REFERRING PHYSICIAN:  Eric L. August Saucer, M.D.   CHIEF COMPLAINT:  Shortness of breath with activities for six months and  occasional chest pain associated with nausea and the patient gaining seven  pounds of weight.   PAST MEDICAL HISTORY:  1.  Diabetes for five years.  2.  Hypertension for eight years.  3.  No history of smoking.  4.  Admits to variable alcohol use.  5.  No drug use.  6.  Has history of cholesterol elevation with treatment.  7.  No history of myocardial infarction.  8.  History of morbid obesity.  9.  Positive history of premature coronary disease in family.   PAST SURGICAL HISTORY:  1.  The patient had a __________  PTCA x4, last cardiac catheterization in      2002.  2.  Also had anal sphincterotomy in 1993.  3.  Back surgery with bone build-up and titanium screws in February 2005.  4.  Tonsillectomy at age 85.   MEDICATIONS:  1.  Lipitor 20 mg one p.o. daily.  2.  Cardizem __________ mg one p.o. daily.  3.  Lexapro 10 mg daily.  4.  Plavix 75 mg daily.  5.  Aspirin 325 mg one daily.  6.  Wellbutrin XL 300 mg daily.  7.  Allopurinol 300 mg daily.  8.  Lasix 20 mg daily.  9.  Avandamet 07/998 half twice daily.  10. Coreg 6.25 mg one twice daily.  11. Uroxatral 10 mg one at bedtime.  12. Albuterol hand-held nebulizer treatment as needed.  13. CPAP machine at night time.   ALLERGIES:  SULFA, POLLEN, DUST, GRASS.   SOCIAL HISTORY:  The patient is divorced for 24 years, has one son 71 years  old, who is a Stage manager in [employer], and has one daughter 64 years old, who is a  Engineer, civil (consulting).   FAMILY HISTORY:  Mother died of  congestive heart failure at age 34.  Father  died of myocardial infarction, age 76.  The patient does not have any  brother or sister.   REVIEW OF SYSTEMS:  The patient admits to recent weight gain.  Has recent  changes with wearing reading glasses.  No history of cataract surgery.  No  hearing loss.  No tinnitus, rhinorrhea, or dentures.  Also no history of  cough, hemoptysis, or pneumonias.  No history of palpitations, dizziness.  Positive history for asthma, COPD, dyspnea on exertion, chest pain, nausea,  vomiting, and kidney stones.  No history of GI bleed, stomach ulcer, hiatal  hernia, hepatitis, blood transfusion, stroke, seizures, or joint pains.  Positive history of psychiatric admissions.   IMMUNIZATION:  Not up to date for tetanus, diphtheria, flu shot, or  pneumonia shot.   PHYSICAL EXAMINATION:  VITAL SIGNS:  Pulse 74, respirations 13, blood  pressure 140/70.  Height 72 inches, weight 326 pounds.  HEENT:  The patient is normocephalic, atraumatic with light brown eyes,  pupils equal and reactive to light.  Extraocular movements intact.  The  patient wears glasses and lost a few molars.  NECK:  No JVD, no carotid bruit.  CHEST:  Lungs clear bilaterally.  CARDIAC:  Normal S1, S2, with grade 2/6 systolic murmur.  ABDOMEN:  Soft, distended, and nontender.  EXTREMITIES:  1+ edema, no cyanosis or clubbing.  The patient has an L-  shaped scar over the left lower leg medial aspect, appears to be healing  well.  CENTRAL NERVOUS SYSTEM:  Cranial nerves are grossly intact with bilateral  equal grips but weakness due to arthritic changes.   LABORATORY DATA:  Pending.   EKG:  Normal sinus rhythm with occasional VPCs and nonspecific T-wave  changes.   IMPRESSION:  1.  Chest pain, rule out myocardial infarction.  2.  Hyperlipidemia.  3.  Obesity.  4.  Depression.   PLAN:  Admit the patient for rule out MI and due to known coronary artery  disease, consider cardiac  catheterization versus nuclear stress test.       ASK/MEDQ  D:  03/17/2004  T:  03/18/2004  Job:  [id number]   cc:   Minerva Areola L. August Saucer, M.D.  [address] [address]  [place]  [place] [address]  Fax: [phone]

## 2010-10-28 NOTE — Consult Note (Signed)
[facility]  Patient:    GEORGIA BARIA                         MRN: [id number] Proc. Date: 04/25/99 Adm. Date:  [id number] Attending:  Gwenyth Bender CC:         Lind Guest. August Saucer, M.D.             Ricki Rodriguez, M.D.                          Consultation Report  REASON FOR CONSULTATION:   Urinary retention, urinary tract infection.  BRIEF HISTORY:  A 75 year old white male who was admitted two days ago with recurrent urinary retention and a urinary tract infection.  He has a long previous history of urethral stricture disease, treated by several urologists in several  different towns.  His last followup was several years ago.  He normally has urinary frequency, a slow stream, and a feeling of incomplete emptying.  He denies any current gross hematuria.  He underwent cardiac catheterization and PTCA/stent placement last week.  He had problems voiding since that time.  He went home on in and out catheterization by his daughter, who is nurse.  Apparently, this was unsuccessful, and the patient  represented with what sounds like a urinary tract infection based on history and retention.  Since that time, he has been placed on Flomax 0.4 mg daily.  He has an indwelling Foley catheter.  He has a urine culture that is currently pending - it was reincubated for better growth.  PAST MEDICAL HISTORY:  See chart.  PHYSICAL EXAMINATION:  GENERAL:  A friendly, obese, middle-aged gentleman.  GU:  His phallus has been circumcised.  There was a Foley catheter present in his meatus.  There were no penile plaque lesions or fibrotic areas.  There were no inguinal hernias.  Testicles were normal shape, size, and consistency, descended bilaterally.  Epididymes and cords were normal.  RECTAL:  Normal sphincter tone.  He had a 2 to 3+ gland.  There was a nodule in the midline at the apex which may be an anatomic variant of normal.  The left lobe f the  prostate was larger than the right.  There was no prostatic tenderness. There were no rectal masses.  IMPRESSION: 1. Prostate nodule. 2. Urinary tract infection. 3. Urinary retention, specified. 4. History of urethral stricture disease. 5. Elevated PSA with recently performed PSA level being 9.6.  This should be    disregarded as the patient currently has urinary tract infection and has    been instrumented transurethrally, both can cause spuriously high values.  PLAN: 1. Continue antibiotics, only I think it is appropriate at this time to switch    to a p.o. antibiotic.  I have taken the liberty of placing the patient on Cipro    250 mg b.i.d.  He should go home with 10 days of this. 2. Continue Flomax 0.4 mg daily. 3. Send patient home with indwelling Foley catheter.  He will remove this next    Monday morning. 4. I will see the patient Monday afternoon for voiding trial and to check a    postvoid residual. 5. Will need to recheck PSA once the infection has subsided and he no longer has    the Foley catheter in place. 6. Will reevaluate prostate nodule, and patient will possibly need a transrectal  ultrasound biopsy. DD:  04/25/99 TD:  04/25/99 Job: [id number]

## 2010-10-28 NOTE — Discharge Summary (Signed)
[name], [name]               ACCOUNT NO.:  [id number]   MEDICAL RECORD NO.:  [id number]          PATIENT TYPE:  INP   LOCATION:  3708                         FACILITY:  [facility]   PHYSICIAN:  [name], M.D.  DATE OF BIRTH:  1935-10-13   DATE OF ADMISSION:  03/17/2004  DATE OF DISCHARGE:  03/19/2004                                 DISCHARGE SUMMARY   PRINCIPAL DIAGNOSES:  1.  Coronary atherosclerosis of native coronary vessels.  2.  Diabetes mellitus type 2.  3.  Hypertension.  4.  Status post percutaneous transluminal coronary angioplasty of coronary      vessels.  5.  Hyperlipidemia.  6.  Depressive disorder.   PRINCIPAL PROCEDURE:  Left heart catheterization, selective coronary  angiography, done by Dr. Orpah Adams on March 17, 2004.   DISCHARGE MEDICATIONS:  1.  Lipitor 40 mg one daily.  2.  Cardizem 420 mg one daily.  3.  Lexapro 10 mg one daily.  4.  Plavix 75 mg one daily.  5.  Aspirin 325 mg 1daily.  6.  Wellbutrin XL 300 mg one daily.  7.  Lasix 20 mg one daily.  8.  Avandia 2 mg one daily.  9.  Coreg 6.25 mg one b.i.d.  10. Uroxatral 10 mg one at bedtime.  11. Continuous positive airway pressure machine to 9 cmH2O.   PAIN MANAGEMENT:  Patient to use liquid Advil as directed.   DISCHARGE ACTIVITY:  As tolerated.   DIET:  Lowfat, low salt, 1,500 calorie diabetic diet.   WOUND CARE:  Patient to notify right groin pain, swelling, or discharge.   SPECIAL INSTRUCTIONS:  Patient to get a liver function test in two months.   FOLLOWUP:  With Dr. Orpah Adams in two weeks, and by Dr. Willey Adams in one  to two months.   HISTORY:  This 75 year old white male presented with shortness of breath  with activity for six months along with occasional chest pain.   PAST MEDICAL HISTORY:  1.  Diabetes.  2.  Hypertension.  3.  Elevated cholesterol level.  4.  The patient had a cardiac catheterization plus PTCA x 4 in the last 10      years.   PHYSICAL  EXAMINATION:  PULSE:  74.  RESPIRATIONS:  13.  BLOOD PRESSURE:  140/70.  HEIGHT:  6 feet.  WEIGHT:  326 pounds.  HEENT:  The patient was normocephalic, atraumatic, with light brown eyes.  Pupils equal and reactive to light.  Extraocular movements intact.  Patient  wearing glasses, and having lost a few molars.  NECK:  No JVD, no carotid bruits.  LUNGS:  Clear bilaterally.  HEART:  Normal S1, S2, with grade 2/6 systolic murmur.  ABDOMEN:  Soft, distended, and nontender.  EXTREMITIES:  1+ edema.  No cyanosis or clubbing.  Patient having an L-  shaped scar over the left lower leg medial aspect, appeared to be healing  well.  CNS:  Cranial nerves grossly intact, with weakness in bilateral grips due to  arthritic changes.   EKG:  Sinus rhythm, with occasional PVCs and nonspecific T  wave changes.   LABORATORY DATA:  Normal WBC count, platelet count, hemoglobin 12.6,  hematocrit 35.8.  Normal PT, INR, and PTT.  Normal electrolytes, BUN,  creatinine.  Sugar borderline at 124.  Normal liver enzymes.  Hemoglobin A1c  slightly high at 7.  CK, troponin I, and CK-MB normal x 3.  Cholesterol  level 172, triglyceride level slightly elevated at 173, HDL cholesterol 37,  LDL cholesterol 100.   Cardiac catheterization showed mild multivessel coronary artery disease,  patent LAD stent site, and a mild left ventricular systolic dysfunction.   HOSPITAL COURSE:  The patient was admitted to the telemetry unit.  Myocardial infarction was ruled out.  He underwent cardiac catheterization  that showed mild multivessel native vessel coronary artery disease, with a  patent left anterior descending coronary artery stent site and also showed  mild left ventricular systolic dysfunction.  Overall, the patient's  condition was stable.  Hence, he was discharged home in satisfactory  condition, with followup by me in one to two weeks, and by Dr. Willey Adams in  two to four weeks.      [name]   ASK/MEDQ  D:   05/04/2004  T:  05/04/2004  Job:  [id number]

## 2010-10-28 NOTE — Consult Note (Signed)
NAMEKEATEN, MASHEK               ACCOUNT NO.:  [id number]   MEDICAL RECORD NO.:  [id number]          PATIENT TYPE:  INP   LOCATION:  3741                         FACILITY:  [facility]   PHYSICIAN:  Doralee Albino. Carola Frost, M.D. DATE OF BIRTH:  Mar 11, 1936   DATE OF CONSULTATION:  03/03/2005  DATE OF DISCHARGE:                                   CONSULTATION   REQUESTING PHYSICIAN:  Dr. Gwenyth Bender.   REASON FOR CONSULTATION:  Possible right total knee infection.   BRIEF HISTORY OF PRESENTATION:  [name] is a very pleasant 75 year old  white male with multiple medical problems who underwent total knee  arthroplasty by Dr. Sol Blazing in [place], [place] in August  of this year.  He was taken postoperatively to the rehab center there in the  hospital and at some point developed a wound dehiscence with possible  infection.  He was taken back to the OR where an irrigation and debridement  were performed, and he was placed empirically on vancomycin for antibiotic  coverage.  At some point in the next several days, he followed up with Dr.  Deretha Emory, who informed him that he did not have an infection and  consequently the PICC line was removed and antibiotics discontinued.  He was  then sent home and became ill over the next several days with difficulties  related to either infection, which resulted in chills and fever, or from the  narcotic combination of OxyContin and oxycodone.  He then was admitted 5  days ago and continued on empiric vancomycin.  Laboratory studies at that  time were notable for a white cell count of about 10 with sed rate of 17.  No CRP was obtained.  He reports feeling well and denies any specific  complaints at this time.  He feels very confident in Dr. Deretha Emory and is not  averse to following up with him at all.  He has no records of his visit  there and no record of his laboratory studies; similarly, he is not entirely  clear about the exact sequence of  events, nor what actually took place  surgically.   PAST MEDICAL HISTORY:  1.  Coronary artery disease.  2.  Hypertension.  3.  Non-insulin-dependent diabetes.  4.  Back surgery.  5.  Depression with suicidal ideation.  6.  Sleep apnea.  7.  BPH.   ALLERGIES:  SULFA.   MEDICATIONS:  Cardizem, Lipitor, Wellbutrin, Flomax, aspirin, Coreg,  Claritin, Celebrex, Plavix, Lexapro, Avandia, Percocet and UroXatral.   SOCIAL HISTORY:  The patient has 2 children, 1 of which is a nurse at the  hospital in [facility] where he had his surgery.   FAMILY HISTORY:  Noncontributory.   PERTINENT LABORATORY STUDIES ON ADMISSION:  White count of 10.5, hemoglobin  of 11.1, UA with questionable infection and potassium of 3.3.   PHYSICAL EXAMINATION:  GENERAL:  On physical examination, [name]  appears to be quite comfortable.  He is appropriate for stated age.  He is  overweight, significantly.  He is afebrile currently.  EXTREMITIES:  He does  not have any significant erythema of his right lower  extremity.  He does have some scabbing along an anterior knee incision.  His  range of motion of the right knee is from 15 degrees actively to 90 degrees  passively.  He can be stretched to within 5 degrees of full extension.  He  has no tenderness to palpation of his knee.  There is no redness or  tenderness along his calves; he does have some mild edema which is equal  bilaterally.   X-RAYS:  AP and lateral views of the knee show no complicating features of  the total knee arthroplasty.  His alignment is acceptable.  There are no  significant bony changes noted.   ADDITIONAL STUDIES:  Duplex ultrasound was negative for DVT.   ASSESSMENT:  [name] has a complex problem regarding his right total  knee and one which really can only be clearly elucidated by his treating  surgeon, Dr. Deretha Emory.  Possibilities include superficial infection, deep  infection involving the total knee with resistant  or sensitive bacteria,  third -- a simple wound dehiscence, or also possible reaction to his  narcotic medications.   PLAN:  I have explained to [name] at length the options for treatment.  If this is a deep infection of his total knee with MRSA, then he would  benefit most from removal of the implants, placement of an antibiotic spacer  and a second-stage knee replacement after 6-8 weeks of IV antibiotics and  negative repeat aspirations of his knee.  If this is not a resistant  bacteria, then he could undergo exchange of the polyethylene spacer, an open  synovectomy with 6-8 weeks of IV antibiotics under the management of  Infectious Disease Service and go on to retain his current total knee  components.  Baseline sed rate was not elevated and baseline CRP should also  be drawn.  Only Dr. Deretha Emory knows whether there was infection and at what  level it was.  He is the one most capable of treating him.  Should Mr.  [name] wish to follow up locally, then I would recommend that he obtain a  complete set of records from Dr. Deretha Emory and the hospital there to assist  in his local management.  If he were then to proceed locally, he would most  likely require a total joint specialist and infectious disease specialist as  well.  At the current time, it is unclear how long he should be continued on  IV antibiotics and this again would be a question that Dr. Deretha Emory should  weigh in on.  Certainly, he needs early followup in the period after which  he discontinues his antibiotics; at that time, an aspiration may be reliably  done in order to obtain an organism. While he is asymptomatic and on  antibiotics, culture results would not be reliable.   If I may be of any further assistance, please contact me.      Doralee Albino. Carola Frost, M.D.  Electronically Signed     MHH/MEDQ  D:  03/03/2005  T:  03/06/2005  Job:  [id number]

## 2010-10-28 NOTE — Discharge Summary (Signed)
NAMEJENSON, Adams               ACCOUNT NO.:  [id number]   MEDICAL RECORD NO.:  [id number]          PATIENT TYPE:  INP   LOCATION:  3741                         FACILITY:  [facility]   PHYSICIAN:  Eric L. August Saucer, M.D.     DATE OF BIRTH:  Sep 25, 1935   DATE OF ADMISSION:  02/25/2005  DATE OF DISCHARGE:  03/08/2005                                 DISCHARGE SUMMARY   DISCHARGE DIAGNOSES:  1.  Cellulitis of the leg. (682.6)  2.  Diabetes mellitus type 2. (250.02)  3.  Chronic obstructive airway disease.  (496)  4.  Coronary arthrosclerosis of native coronary vessels. (414.01)  5.  Hypertension.  (401.9)  6.  Percutaneous coronary angioplasty status. (V45.82)  7.  Sleep apnea. (780.57)  8.  Benign prostatic hypertrophy. (600.00)  9.  Hypokalemia. (276.8)  10. Morbid obesity. (278.01)  11. Hyperlipidemia. (272.4).  12. Knee joint replacement status. (V43.65)  13. Iron deficiency anemia. (280.9)  14. Osteoarthrosis of the left leg. (715.36)  15. Recurrent depression. (296.35)   PROCEDURE:  Transfusion of packed cells.   HISTORY OF PRESENT ILLNESS:  This was the second recent [facility]  admission for this 75 year old, divorced, white male with history of  coronary artery disease, hypertension, diabetes mellitus, depression, status  post knee replacement who was admitted with complaints of chills, tender  left leg and nausea.  The patient reports he had stents placed in his  coronary arteries approximately 5 years ago.  He denies angina recently.  The patient had a knee replacement at [facility] that required 5  weeks of hospitalization due to infection of the right knee.  The patient  was discharged 3-4 days prior to admission and was doing fairly well until  his right leg noted increase in discomfort and redness prompted him to seek  medical attention.  The patient denied having any fevers, but had been  having shaking chills with nausea, although no vomiting.  The  patient was  seen in the emergency room at [facility] and evaluated with  subsequent admission for cellulitis of the right leg.  The patient states  that his calf area was quite tender as well.   Past medical history and physical exam is as mentioned in the H&P.   HOSPITAL COURSE:  The patient was admitted for further treatment of acute  cellulitis.  He has documented history of coronary artery disease,  hypertension and diabetes mellitus as well.  He was initially evaluated and  treated by Dr. Shana Chute.  He was placed on vancomycin for cellulitis.  He  also underwent venous Dopplers to exclude DVT which was found to be  negative.  The patient was continued on his regimen for his diabetes  mellitus with elevation of his legs as well.  Based on patient's hospital  records from his other hospital, there was a question of a MRSA infection.  This, however, was not confirmed.  The patient was therefore continued on IV  vancomycin for a total of 10 days.   The patient also, during the time of admission, acknowledged his significant  problems with depression with some suicidal ideations.  He was evaluated by  Dr. Jeanie Sewer as well.  It was felt that he was not actively suicidal at  that time, but did suffer from major depression.  Medication adjustments  were recommended and followed.  The patient did tolerate this regimen as  well.  This basically consisted of Lexapro as well as a combination of  Wellbutrin at 300 mg daily.  The patient has had outpatient followup  previously and this was encouraged as well.  With continued therapy, the  patient did make gradual progress.  He was noted to have mild persistent  effusion.  He was subsequently seen in consultation by Dr. Carola Frost of  orthopedics.  It was recommended that if patient's previous hospitalization  in [facility] did demonstrate infection, then more aggressive  treatment would be needed.  However, on September 23,  consultation with the  patient's orthopedic surgeon, Dr. Deretha Emory, confirmed that his wound  cultures were indeed negative.  It was recommended that he have a complete  10-day course of vancomycin which was continued.   The patient thereafter continued to make gradual progress.  He was slowly  ambulated as the right knee inflammation subsided.  He tolerated this well.  He also underwent further adjustments of his diabetic regimen as well as  education again.   By September 27, he was doing considerably better.  Right knee showed only a  residual Eschar which had decreased in size.  There was significant decrease  in warmth and erythema.  He was afebrile and ambulatory.  He was  subsequently felt to be safe for discharge.  Arrangements were made for the  patient to be seen by Dr. Carola Frost in 2 weeks' time.   DISCHARGE MEDICATIONS:  1.  Cardizem 20 mg p.o. daily.  2.  Lipitor 20 mg p.o. daily.  3.  Wellbutrin XL 300 mg daily.  4.  Flomax 0.4 mg nightly.  5.  Coreg 6.25 mg b.i.d.  6.  Claritin 10 mg daily.  7.  Plavix 75 mg daily.  8.  Avandia 4 mg daily.  9.  Ferrous sulfate 325 mg b.i.d.  10. Nitroglycerin patch 0.4 mg daily.  11. Protonix 40 mg daily.  12. Lasix 20 mg daily.  13. Celebrex 200 mg daily.  14. Bactroban ointment twice a day to incision site.  15. Levaquin 750 mg daily x2 weeks.  16. Aspirin 81 mg daily.  17. Lexapro 10 mg p.o. daily.  18. K-Dur 20 mEq b.i.d.  19. Os-Cal 500 mg b.i.d.  20. Centrum Silver one daily.  21. Colace 100 mg b.i.d.  22. Spiriva one inhalation daily.  23. Allopurinol 300 mg daily.  24. Sliding scale insulin 150-200, 2 units; 200-250, 4 units; 251-300, 6      units; 301-350, 8 units; greater than 350, 12 units and contact      physician.   FOLLOW UP:  The patient is to be seen in our office in 2 weeks' time.           ______________________________  Lind Guest August Saucer, M.D.    ELD/MEDQ  D:  05/03/2005  T:  05/04/2005  Job:  [id number]

## 2011-03-23 LAB — COMPREHENSIVE METABOLIC PANEL
ALT: 19
AST: 10
AST: 8
AST: 9
Albumin: 3.2 — ABNORMAL LOW
Albumin: 3.4 — ABNORMAL LOW
Alkaline Phosphatase: 148 — ABNORMAL HIGH
CO2: 27
Calcium: 9
Calcium: 9.6
Chloride: 103
Creatinine, Ser: 1.21
Creatinine, Ser: 1.41
Creatinine, Ser: 1.65 — ABNORMAL HIGH
GFR calc Af Amer: 50 — ABNORMAL LOW
GFR calc Af Amer: >60
GFR calc Af Amer: >60
GFR calc non Af Amer: 43 — ABNORMAL LOW
GFR calc non Af Amer: 59 — ABNORMAL LOW
Glucose, Bld: 153 — ABNORMAL HIGH
Glucose, Bld: 166 — ABNORMAL HIGH
Glucose, Bld: 257 — ABNORMAL HIGH
Potassium: 4.5
Potassium: 4.6
Sodium: 135
Total Bilirubin: 0.5
Total Bilirubin: 0.7
Total Bilirubin: 0.9
Total Protein: 7
Total Protein: 7.7

## 2011-03-23 LAB — CBC
HCT: 32.9 — ABNORMAL LOW
Hemoglobin: 10.7 — ABNORMAL LOW
Hemoglobin: 10.9 — ABNORMAL LOW
Hemoglobin: 11.3 — ABNORMAL LOW
MCHC: 33.8
MCHC: 34.3
MCV: 85.7
Platelets: 253
Platelets: 274
RBC: 3.72 — ABNORMAL LOW
RBC: 3.84 — ABNORMAL LOW
RDW: 14.5 — ABNORMAL HIGH
RDW: 15 — ABNORMAL HIGH
WBC: 8.1

## 2011-03-23 LAB — D-DIMER, QUANTITATIVE: D-Dimer, Quant: 1.95 — ABNORMAL HIGH

## 2011-03-23 LAB — B-NATRIURETIC PEPTIDE (CONVERTED LAB): Pro B Natriuretic peptide (BNP): 112 — ABNORMAL HIGH

## 2011-03-23 LAB — CARDIAC PANEL(CRET KIN+CKTOT+MB+TROPI)
Total CK: 77
Troponin I: 0.02

## 2011-03-27 LAB — BASIC METABOLIC PANEL
BUN: 21
Calcium: 8.9
GFR calc non Af Amer: 54 — ABNORMAL LOW
Potassium: 4
Sodium: 133 — ABNORMAL LOW

## 2011-03-27 LAB — CBC
Platelets: 282
RBC: 4.07 — ABNORMAL LOW
WBC: 7.7

## 2011-09-21 ENCOUNTER — Emergency Department (HOSPITAL_COMMUNITY): Payer: Medicare Other

## 2011-09-21 ENCOUNTER — Inpatient Hospital Stay (HOSPITAL_COMMUNITY)
Admission: EM | Admit: 2011-09-21 | Discharge: 2011-10-03 | DRG: 264 | Disposition: A | Discharge status: Home Health | Payer: Medicare Other | Attending: Internal Medicine | Admitting: Internal Medicine

## 2011-09-21 ENCOUNTER — Encounter (HOSPITAL_COMMUNITY): Payer: Self-pay | Admitting: General Practice

## 2011-09-21 DIAGNOSIS — IMO0002 Reserved for concepts with insufficient information to code with codable children: Secondary | ICD-10-CM | POA: Diagnosis present

## 2011-09-21 DIAGNOSIS — I129 Hypertensive chronic kidney disease with stage 1 through stage 4 chronic kidney disease, or unspecified chronic kidney disease: Secondary | ICD-10-CM | POA: Diagnosis present

## 2011-09-21 DIAGNOSIS — M1990 Unspecified osteoarthritis, unspecified site: Secondary | ICD-10-CM | POA: Diagnosis present

## 2011-09-21 DIAGNOSIS — E039 Hypothyroidism, unspecified: Secondary | ICD-10-CM | POA: Diagnosis present

## 2011-09-21 DIAGNOSIS — Z951 Presence of aortocoronary bypass graft: Secondary | ICD-10-CM

## 2011-09-21 DIAGNOSIS — J4489 Other specified chronic obstructive pulmonary disease: Secondary | ICD-10-CM | POA: Diagnosis present

## 2011-09-21 DIAGNOSIS — D638 Anemia in other chronic diseases classified elsewhere: Secondary | ICD-10-CM | POA: Diagnosis present

## 2011-09-21 DIAGNOSIS — J449 Chronic obstructive pulmonary disease, unspecified: Secondary | ICD-10-CM | POA: Diagnosis present

## 2011-09-21 DIAGNOSIS — Z6841 Body Mass Index (BMI) 40.0 and over, adult: Secondary | ICD-10-CM

## 2011-09-21 DIAGNOSIS — N058 Unspecified nephritic syndrome with other morphologic changes: Secondary | ICD-10-CM | POA: Diagnosis present

## 2011-09-21 DIAGNOSIS — J9610 Chronic respiratory failure, unspecified whether with hypoxia or hypercapnia: Secondary | ICD-10-CM | POA: Diagnosis present

## 2011-09-21 DIAGNOSIS — E662 Morbid (severe) obesity with alveolar hypoventilation: Secondary | ICD-10-CM | POA: Diagnosis present

## 2011-09-21 DIAGNOSIS — Z794 Long term (current) use of insulin: Secondary | ICD-10-CM

## 2011-09-21 DIAGNOSIS — Z79899 Other long term (current) drug therapy: Secondary | ICD-10-CM

## 2011-09-21 DIAGNOSIS — E119 Type 2 diabetes mellitus without complications: Secondary | ICD-10-CM

## 2011-09-21 DIAGNOSIS — N19 Unspecified kidney failure: Secondary | ICD-10-CM

## 2011-09-21 DIAGNOSIS — I2589 Other forms of chronic ischemic heart disease: Secondary | ICD-10-CM | POA: Diagnosis present

## 2011-09-21 DIAGNOSIS — N179 Acute kidney failure, unspecified: Secondary | ICD-10-CM | POA: Diagnosis present

## 2011-09-21 DIAGNOSIS — N2589 Other disorders resulting from impaired renal tubular function: Secondary | ICD-10-CM | POA: Diagnosis present

## 2011-09-21 DIAGNOSIS — Z8701 Personal history of pneumonia (recurrent): Secondary | ICD-10-CM

## 2011-09-21 DIAGNOSIS — I5033 Acute on chronic diastolic (congestive) heart failure: Principal | ICD-10-CM | POA: Diagnosis present

## 2011-09-21 DIAGNOSIS — M810 Age-related osteoporosis without current pathological fracture: Secondary | ICD-10-CM | POA: Diagnosis present

## 2011-09-21 DIAGNOSIS — N2581 Secondary hyperparathyroidism of renal origin: Secondary | ICD-10-CM | POA: Diagnosis present

## 2011-09-21 DIAGNOSIS — N189 Chronic kidney disease, unspecified: Secondary | ICD-10-CM | POA: Diagnosis present

## 2011-09-21 DIAGNOSIS — E8809 Other disorders of plasma-protein metabolism, not elsewhere classified: Secondary | ICD-10-CM | POA: Diagnosis present

## 2011-09-21 DIAGNOSIS — E871 Hypo-osmolality and hyponatremia: Secondary | ICD-10-CM | POA: Diagnosis present

## 2011-09-21 DIAGNOSIS — Z96659 Presence of unspecified artificial knee joint: Secondary | ICD-10-CM

## 2011-09-21 DIAGNOSIS — J441 Chronic obstructive pulmonary disease with (acute) exacerbation: Secondary | ICD-10-CM | POA: Diagnosis present

## 2011-09-21 DIAGNOSIS — E1165 Type 2 diabetes mellitus with hyperglycemia: Secondary | ICD-10-CM | POA: Diagnosis present

## 2011-09-21 DIAGNOSIS — I509 Heart failure, unspecified: Secondary | ICD-10-CM | POA: Diagnosis present

## 2011-09-21 DIAGNOSIS — Z880 Allergy status to penicillin: Secondary | ICD-10-CM

## 2011-09-21 DIAGNOSIS — Z7982 Long term (current) use of aspirin: Secondary | ICD-10-CM

## 2011-09-21 DIAGNOSIS — G4733 Obstructive sleep apnea (adult) (pediatric): Secondary | ICD-10-CM | POA: Diagnosis present

## 2011-09-21 DIAGNOSIS — I2789 Other specified pulmonary heart diseases: Secondary | ICD-10-CM | POA: Diagnosis present

## 2011-09-21 DIAGNOSIS — L03317 Cellulitis of buttock: Secondary | ICD-10-CM | POA: Diagnosis present

## 2011-09-21 DIAGNOSIS — Z9119 Patient's noncompliance with other medical treatment and regimen: Secondary | ICD-10-CM

## 2011-09-21 DIAGNOSIS — F329 Major depressive disorder, single episode, unspecified: Secondary | ICD-10-CM | POA: Diagnosis present

## 2011-09-21 DIAGNOSIS — N184 Chronic kidney disease, stage 4 (severe): Secondary | ICD-10-CM | POA: Diagnosis present

## 2011-09-21 DIAGNOSIS — I2510 Atherosclerotic heart disease of native coronary artery without angina pectoris: Secondary | ICD-10-CM | POA: Diagnosis present

## 2011-09-21 DIAGNOSIS — Z91199 Patient's noncompliance with other medical treatment and regimen due to unspecified reason: Secondary | ICD-10-CM

## 2011-09-21 DIAGNOSIS — I739 Peripheral vascular disease, unspecified: Secondary | ICD-10-CM | POA: Diagnosis present

## 2011-09-21 DIAGNOSIS — L0231 Cutaneous abscess of buttock: Secondary | ICD-10-CM | POA: Diagnosis present

## 2011-09-21 DIAGNOSIS — R5381 Other malaise: Secondary | ICD-10-CM | POA: Diagnosis present

## 2011-09-21 DIAGNOSIS — A4901 Methicillin susceptible Staphylococcus aureus infection, unspecified site: Secondary | ICD-10-CM | POA: Diagnosis present

## 2011-09-21 HISTORY — DX: Depression, unspecified: F32.A

## 2011-09-21 HISTORY — DX: Sleep related hypoventilation in conditions classified elsewhere: G47.36

## 2011-09-21 HISTORY — DX: Cardiac arrhythmia, unspecified: I49.9

## 2011-09-21 HISTORY — DX: Reserved for concepts with insufficient information to code with codable children: IMO0002

## 2011-09-21 HISTORY — DX: Obstructive sleep apnea (adult) (pediatric): G47.33

## 2011-09-21 HISTORY — DX: Major depressive disorder, single episode, unspecified: F32.9

## 2011-09-21 LAB — BASIC METABOLIC PANEL
BUN: 39 mg/dL — ABNORMAL HIGH (ref 6–23)
CO2: 16 mEq/L — ABNORMAL LOW (ref 19–32)
Calcium: 8 mg/dL — ABNORMAL LOW (ref 8.4–10.5)
Chloride: 97 mEq/L (ref 96–112)
Creatinine, Ser: 3.78 mg/dL — ABNORMAL HIGH (ref 0.50–1.35)
GFR calc Af Amer: 17 mL/min — ABNORMAL LOW (ref >90)
GFR calc non Af Amer: 15 mL/min — ABNORMAL LOW (ref >90)
Glucose, Bld: 487 mg/dL — ABNORMAL HIGH (ref 70–99)
Potassium: 4 mEq/L (ref 3.5–5.1)
Sodium: 127 mEq/L — ABNORMAL LOW (ref 135–145)

## 2011-09-21 LAB — CBC
HCT: 28.2 % — ABNORMAL LOW (ref 39.0–52.0)
Hemoglobin: 9.9 g/dL — ABNORMAL LOW (ref 13.0–17.0)
MCH: 29.7 pg (ref 26.0–34.0)
MCHC: 35.1 g/dL (ref 30.0–36.0)
MCV: 84.7 fL (ref 78.0–100.0)
Platelets: 217 10*3/uL (ref 150–400)
RBC: 3.33 MIL/uL — ABNORMAL LOW (ref 4.22–5.81)
RDW: 13.3 % (ref 11.5–15.5)
WBC: 9.5 10*3/uL (ref 4.0–10.5)

## 2011-09-21 LAB — URINALYSIS, ROUTINE W REFLEX MICROSCOPIC
Glucose, UA: 250 mg/dL — AB
Specific Gravity, Urine: 1.019 (ref 1.005–1.030)
Urobilinogen, UA: 1 mg/dL (ref 0.0–1.0)

## 2011-09-21 LAB — URINE MICROSCOPIC-ADD ON

## 2011-09-21 LAB — HEPATIC FUNCTION PANEL
ALT: 8 U/L (ref 0–53)
Albumin: 3 g/dL — ABNORMAL LOW (ref 3.5–5.2)
Alkaline Phosphatase: 156 U/L — ABNORMAL HIGH (ref 39–117)
Total Protein: 8.1 g/dL (ref 6.0–8.3)

## 2011-09-21 LAB — TROPONIN I: Troponin I: <0.3 ng/mL (ref <0.30)

## 2011-09-21 LAB — CARDIAC PANEL(CRET KIN+CKTOT+MB+TROPI)
CK, MB: 2.3 ng/mL (ref 0.3–4.0)
Troponin I: <0.3 ng/mL (ref <0.30)

## 2011-09-21 LAB — PRO B NATRIURETIC PEPTIDE: Pro B Natriuretic peptide (BNP): 2501 pg/mL — ABNORMAL HIGH (ref 0–125)

## 2011-09-21 MED ORDER — ALBUTEROL SULFATE (5 MG/ML) 0.5% IN NEBU
2.5000 mg | INHALATION_SOLUTION | Freq: Four times a day (QID) | RESPIRATORY_TRACT | Status: DC
  Filled 2011-09-21: qty 0.5

## 2011-09-21 MED ORDER — INSULIN REGULAR HUMAN 100 UNIT/ML IJ SOLN: 15.0000 [IU] | Freq: Once | INTRAMUSCULAR | Status: DC

## 2011-09-21 MED ORDER — LEVOTHYROXINE SODIUM 50 MCG PO TABS
50.0000 ug | ORAL_TABLET | Freq: Every day | ORAL | Status: DC
  Administered 2011-09-22 – 2011-10-03 (×12): 50 ug via ORAL
  Filled 2011-09-21 (×12): qty 1

## 2011-09-21 MED ORDER — LINAGLIPTIN 5 MG PO TABS
5.0000 mg | ORAL_TABLET | Freq: Every day | ORAL | Status: DC
  Administered 2011-09-21 – 2011-10-03 (×12): 5 mg via ORAL
  Filled 2011-09-21 (×13): qty 1

## 2011-09-21 MED ORDER — SIMVASTATIN 10 MG PO TABS
10.0000 mg | ORAL_TABLET | Freq: Every day | ORAL | Status: DC
  Administered 2011-09-22 – 2011-10-02 (×11): 10 mg via ORAL
  Filled 2011-09-21 (×12): qty 1

## 2011-09-21 MED ORDER — ALBUTEROL SULFATE (5 MG/ML) 0.5% IN NEBU
2.5000 mg | INHALATION_SOLUTION | RESPIRATORY_TRACT | Status: DC | PRN
  Administered 2011-09-21: 2.5 mg via RESPIRATORY_TRACT

## 2011-09-21 MED ORDER — SODIUM CHLORIDE 0.9 % IJ SOLN
3.0000 mL | Freq: Two times a day (BID) | INTRAMUSCULAR | Status: DC
  Administered 2011-09-24 – 2011-10-03 (×13): 3 mL via INTRAVENOUS

## 2011-09-21 MED ORDER — ALBUTEROL SULFATE HFA 108 (90 BASE) MCG/ACT IN AERS: 2.0000 | INHALATION_SPRAY | Freq: Four times a day (QID) | RESPIRATORY_TRACT | Status: DC | PRN

## 2011-09-21 MED ORDER — ASPIRIN EC 81 MG PO TBEC
81.0000 mg | DELAYED_RELEASE_TABLET | Freq: Every day | ORAL | Status: DC
  Administered 2011-09-21 – 2011-10-03 (×12): 81 mg via ORAL
  Filled 2011-09-21 (×13): qty 1

## 2011-09-21 MED ORDER — HYDROCODONE-ACETAMINOPHEN 5-325 MG PO TABS
1.0000 | ORAL_TABLET | ORAL | Status: DC | PRN
  Administered 2011-09-26 – 2011-09-27 (×2): 2 via ORAL
  Administered 2011-09-29 – 2011-10-01 (×6): 1 via ORAL
  Administered 2011-10-02 – 2011-10-03 (×3): 2 via ORAL
  Filled 2011-09-21: qty 1
  Filled 2011-09-21: qty 2
  Filled 2011-09-21: qty 1
  Filled 2011-09-21 (×2): qty 2
  Filled 2011-09-21 (×2): qty 1
  Filled 2011-09-21: qty 2
  Filled 2011-09-21: qty 1
  Filled 2011-09-21 (×2): qty 2

## 2011-09-21 MED ORDER — INSULIN ASPART 100 UNIT/ML ~~LOC~~ SOLN
0.0000 [IU] | SUBCUTANEOUS | Status: DC
  Administered 2011-09-22: 5 [IU] via SUBCUTANEOUS
  Administered 2011-09-22: 3 [IU] via SUBCUTANEOUS
  Administered 2011-09-22: 8 [IU] via SUBCUTANEOUS
  Administered 2011-09-22 (×2): 5 [IU] via SUBCUTANEOUS
  Administered 2011-09-22: 8 [IU] via SUBCUTANEOUS
  Administered 2011-09-23: 2 [IU] via SUBCUTANEOUS
  Administered 2011-09-23: 5 [IU] via SUBCUTANEOUS
  Administered 2011-09-23: 3 [IU] via SUBCUTANEOUS
  Administered 2011-09-23 (×2): 5 [IU] via SUBCUTANEOUS
  Administered 2011-09-24 (×3): 3 [IU] via SUBCUTANEOUS
  Administered 2011-09-24: 2 [IU] via SUBCUTANEOUS
  Administered 2011-09-24 – 2011-09-25 (×4): 3 [IU] via SUBCUTANEOUS
  Administered 2011-09-25 (×2): 2 [IU] via SUBCUTANEOUS
  Administered 2011-09-25 – 2011-09-26 (×3): 3 [IU] via SUBCUTANEOUS
  Administered 2011-09-26 (×3): 2 [IU] via SUBCUTANEOUS
  Administered 2011-09-26: 5 [IU] via SUBCUTANEOUS
  Administered 2011-09-26: 3 [IU] via SUBCUTANEOUS
  Administered 2011-09-27 (×4): 2 [IU] via SUBCUTANEOUS
  Administered 2011-09-28: 3 [IU] via SUBCUTANEOUS
  Administered 2011-09-28: 2 [IU] via SUBCUTANEOUS
  Administered 2011-09-28: 5 [IU] via SUBCUTANEOUS
  Administered 2011-09-28: 2 [IU] via SUBCUTANEOUS
  Administered 2011-09-29: 3 [IU] via SUBCUTANEOUS
  Administered 2011-09-29 (×2): 2 [IU] via SUBCUTANEOUS
  Filled 2011-09-21: qty 1

## 2011-09-21 MED ORDER — CARVEDILOL 25 MG PO TABS
25.0000 mg | ORAL_TABLET | Freq: Two times a day (BID) | ORAL | Status: DC
  Administered 2011-09-22 – 2011-10-03 (×23): 25 mg via ORAL
  Filled 2011-09-21 (×24): qty 1

## 2011-09-21 MED ORDER — TAMSULOSIN HCL 0.4 MG PO CAPS
0.4000 mg | ORAL_CAPSULE | Freq: Every day | ORAL | Status: DC
  Administered 2011-09-21 – 2011-10-03 (×12): 0.4 mg via ORAL
  Filled 2011-09-21 (×13): qty 1

## 2011-09-21 MED ORDER — INSULIN ASPART 100 UNIT/ML ~~LOC~~ SOLN
15.0000 [IU] | Freq: Once | SUBCUTANEOUS | Status: AC
  Administered 2011-09-21: 15 [IU] via SUBCUTANEOUS
  Filled 2011-09-21: qty 1

## 2011-09-21 MED ORDER — INSULIN GLARGINE 100 UNIT/ML ~~LOC~~ SOLN
20.0000 [IU] | Freq: Two times a day (BID) | SUBCUTANEOUS | Status: DC
  Administered 2011-09-21 – 2011-10-03 (×23): 20 [IU] via SUBCUTANEOUS
  Filled 2011-09-21: qty 1

## 2011-09-21 MED ORDER — SODIUM CHLORIDE 0.9 % IV SOLN
INTRAVENOUS | Status: DC
  Administered 2011-09-22 – 2011-09-24 (×4): via INTRAVENOUS
  Administered 2011-09-25: 75 mL/h via INTRAVENOUS
  Administered 2011-09-26: 04:00:00 via INTRAVENOUS

## 2011-09-21 MED ORDER — BUDESONIDE-FORMOTEROL FUMARATE 160-4.5 MCG/ACT IN AERO
2.0000 | INHALATION_SPRAY | Freq: Two times a day (BID) | RESPIRATORY_TRACT | Status: DC
  Administered 2011-09-22 – 2011-10-03 (×21): 2 via RESPIRATORY_TRACT
  Filled 2011-09-21 (×2): qty 6

## 2011-09-21 NOTE — ED Notes (Signed)
Pt reports "SOB for several days". Pt also reports an "ulcer on bottom for about a week". No chest pain. CPAP at pm.

## 2011-09-21 NOTE — ED Provider Notes (Signed)
History    76 year old male presenting with shortness of breath. Gradual onset several days ago. Patient denies any chest pain. States he feels generally weak and has no energy. No fevers or chills. No abdominal pain. Denies or vomiting. No urinary complaints. Denies any recent medication changes. Reports compliance with his current meds. Patient is also complaining of a sore on his right buttock. Denies trauma. Gradual onset about a week here. No drainage. Painful to touch. CSN: [id number]  Arrival date & time 09/21/11  1646   First MD Initiated Contact with Patient 09/21/11 1706      Chief Complaint  Patient presents with  . Shortness of Breath  . Pressure Ulcer    (Consider location/radiation/quality/duration/timing/severity/associated sxs/prior treatment) HPI  Past Medical History  Diagnosis Date  . Diabetes mellitus   . CHF (congestive heart failure)   . Chronic airway obstruction, not elsewhere classified   . Osteoarthrosis, unspecified whether generalized or localized, unspecified site   . Chronic kidney disease, stage III (moderate)   . Obesity, unspecified   . Osteoarthrosis, unspecified whether generalized or localized, unspecified site   . Osteoporosis   . Depression   . Irregular heart beat     Past Surgical History  Procedure Date  . Knee surgery   . Back surgery   . Cardiac surgery     5 bipasses, carotid artery    History reviewed. No pertinent family history.  History  Substance Use Topics  . Smoking status: Not on file  . Smokeless tobacco: Not on file  . Alcohol Use:       Review of Systems   Review of symptoms negative unless otherwise noted in HPI.   Allergies  Penicillins  Home Medications   Current Outpatient Rx  Name Route Sig Dispense Refill  . ALBUTEROL SULFATE HFA 108 (90 BASE) MCG/ACT IN AERS Inhalation Inhale 2 puffs into the lungs every 6 (six) hours as needed. For shortness of breath.    . ASPIRIN EC 81 MG PO TBEC Oral Take  81 mg by mouth daily.    . BUDESONIDE-FORMOTEROL FUMARATE 160-4.5 MCG/ACT IN AERO Inhalation Inhale 2 puffs into the lungs 2 (two) times daily.    Marland Kitchen CARVEDILOL 25 MG PO TABS Oral Take 25 mg by mouth 2 (two) times daily with a meal.    . FUROSEMIDE 40 MG PO TABS Oral Take 40 mg by mouth daily.     . INSULIN GLARGINE 100 UNIT/ML [place] SOLN Subcutaneous Inject 20 Units into the skin 2 (two) times daily.     Marland Kitchen LEVALBUTEROL HCL 1.25 MG/3ML IN NEBU Nebulization Take 1.25 mg by nebulization every 4 (four) hours as needed. For shortness of breath.    Marland Kitchen LEVOTHYROXINE SODIUM 50 MCG PO TABS Oral Take 50 mcg by mouth daily.    Marland Kitchen LOVASTATIN 40 MG PO TABS Oral Take 40 mg by mouth 2 (two) times daily.    Marland Kitchen RAMIPRIL 10 MG PO CAPS Oral Take 10 mg by mouth 2 (two) times daily.    Marland Kitchen SITAGLIPTIN PHOSPHATE 100 MG PO TABS Oral Take 100 mg by mouth daily.    Marland Kitchen TAMSULOSIN HCL 0.4 MG PO CAPS Oral Take 0.4 mg by mouth daily.      BP 159/65  Pulse 62  Resp 18  Ht 5\' 10"  (1.778 m)  SpO2 100%  Physical Exam  Nursing note and vitals reviewed. Constitutional: He appears well-developed and well-nourished. No distress.       Laying in bed. No acute  distress.  HENT:  Head: Normocephalic and atraumatic.  Eyes: Conjunctivae are normal. Right eye exhibits no discharge. Left eye exhibits no discharge.  Neck: Neck supple.  Cardiovascular: Normal rate, regular rhythm and normal heart sounds.  Exam reveals no gallop and no friction rub.   No murmur heard. Pulmonary/Chest: Effort normal and breath sounds normal. No respiratory distress.  Abdominal: Soft. He exhibits no distension. There is no tenderness.  Musculoskeletal: He exhibits no edema and no tenderness.  Neurological: He is alert.  Skin: Skin is warm and dry. He is not diaphoretic.       Superficial ulcerated lesion to the right buttock with bandage in place. Removed. No drainage. Tender to touch. Mild surrounding induration.  Psychiatric: He has a normal mood and  affect. His behavior is normal. Thought content normal.    ED Course  Procedures (including critical care time)  Labs Reviewed  CBC - Abnormal; Notable for the following:    RBC 3.33 (*)    Hemoglobin 9.9 (*)    HCT 28.2 (*)    All other components within normal limits  PRO B NATRIURETIC PEPTIDE - Abnormal; Notable for the following:    Pro B Natriuretic peptide (BNP) 2501.0 (*)    All other components within normal limits  BASIC METABOLIC PANEL - Abnormal; Notable for the following:    Sodium 127 (*)    CO2 16 (*)    Glucose, Bld 487 (*)    BUN 39 (*)    Creatinine, Ser 3.78 (*)    Calcium 8.0 (*)    GFR calc non Af Amer 15 (*)    GFR calc Af Amer 17 (*)    All other components within normal limits  TROPONIN I   Dg Chest 2 View  09/21/2011  *RADIOLOGY REPORT*  Clinical Data: Shortness of breath.  CHEST - 2 VIEW  Comparison: Chest x-ray 03/15/2007.  Findings: Lung volumes are normal.  No consolidative airspace disease.  No definite pleural effusions (lateral projection is limited by lack of visualization of the inferior costophrenic sulci).  Pulmonary vasculature is normal.  Mild cardiomegaly is unchanged.  Mediastinal contours are unremarkable. The patient is status post median sternotomyfor CABG. Atherosclerotic calcifications are noted within the arch of the aorta.  A small stent is in place, likely within the left anterior descending coronary artery.  IMPRESSION: 1.  No definite radiographic evidence of acute cardiopulmonary disease. 2.  Mild cardiomegaly is unchanged. 3.  Postoperative and post procedural changes, as above.  Original Report Authenticated By: Florencia Reasons, M.D.     1. Renal failure     EKG:  Rhythm:  NSR with PVC Rate: 63 Axis: normal Intervals: NS intraventricular delay ST segments: normal    MDM  76 year old male with shortness of breath. Likely multifactorial Multiple lab abnormalities. Cris 3.8. Per review of records, patient appears to have  mild chronic kidney disease but this is a change from prior labs although from 4 years ago.  Hyperglycemic. Bicarbonate is 16 but suspect that this is more related to renal disease and not diabetic ketoacidosis. Dose of insulin was given but deferred from starting an insulin drip at this time. Hyponatremia, but this corrects to near normal when taking into account glucose. Anemic, but history baseline anemia. Has no symptoms of GI bleed. EKG is non-provocative. Troponin is normal. Chest x-ray shows cardiomegaly, but no acute process.        Raeford Razor, MD 10/03/11 [phone]

## 2011-09-21 NOTE — H&P (Addendum)
PCP:  No primary provider on file.   DOA:  09/21/2011  4:59 PM  Chief Complaint:  Shortness of breath  HPI: 76 year old very pleasant male presents to [facility] ED with progressively worsening shortness of breath and sugar levels that have been difficult to control at home. He reports medical noncompliance with the current medication regimen and offers no specific reasons as to why is that an issue. He denies chest pain, fevers, chills, any specific abdominal or urinary concerns. No other systemic symptoms of weight loss or gain, no orthopnea, no lower extremity edema. He reports similar episodes in the past. In addition , he reports swelling at the area of right buttock, he can not see it, but tells me it feels warm, tender to touch, appears to be getting worse.  Allergies: Allergies  Allergen Reactions  . Penicillins     Prior to Admission medications   Medication Sig Start Date End Date Taking? Authorizing Provider  albuterol (PROVENTIL HFA;VENTOLIN HFA) 108 (90 BASE) MCG/ACT inhaler Inhale 2 puffs into the lungs every 6 (six) hours as needed. For shortness of breath.   Yes Historical Provider, MD  aspirin EC 81 MG tablet Take 81 mg by mouth daily.   Yes Historical Provider, MD  budesonide-formoterol (SYMBICORT) 160-4.5 MCG/ACT inhaler Inhale 2 puffs into the lungs 2 (two) times daily.   Yes Historical Provider, MD  carvedilol (COREG) 25 MG tablet Take 25 mg by mouth 2 (two) times daily with a meal.   Yes Historical Provider, MD  furosemide (LASIX) 40 MG tablet Take 40 mg by mouth daily.    Yes Historical Provider, MD  insulin glargine (LANTUS) 100 UNIT/ML injection Inject 20 Units into the skin 2 (two) times daily.    Yes Historical Provider, MD  levalbuterol (XOPENEX) 1.25 MG/3ML nebulizer solution Take 1.25 mg by nebulization every 4 (four) hours as needed. For shortness of breath.   Yes Historical Provider, MD  levothyroxine (SYNTHROID, LEVOTHROID) 50 MCG tablet Take 50 mcg by mouth daily.    Yes Historical Provider, MD  lovastatin (MEVACOR) 40 MG tablet Take 40 mg by mouth 2 (two) times daily.   Yes Historical Provider, MD  ramipril (ALTACE) 10 MG capsule Take 10 mg by mouth 2 (two) times daily.   Yes Historical Provider, MD  sitaGLIPtin (JANUVIA) 100 MG tablet Take 100 mg by mouth daily.   Yes Historical Provider, MD  Tamsulosin HCl (FLOMAX) 0.4 MG CAPS Take 0.4 mg by mouth daily.   Yes Historical Provider, MD    Past Medical History  Diagnosis Date  . Diabetes mellitus   . CHF (congestive heart failure)   . Chronic airway obstruction, not elsewhere classified   . Osteoarthrosis, unspecified whether generalized or localized, unspecified site   . Chronic kidney disease, stage III (moderate)   . Obesity, unspecified   . Osteoarthrosis, unspecified whether generalized or localized, unspecified site   . Osteoporosis   . Depression   . Irregular heart beat     Past Surgical History  Procedure Date  . Knee surgery   . Back surgery   . Cardiac surgery     5 bipasses, carotid artery    Social History:  does not have a smoking history on file. He does not have any smokeless tobacco history on file. His alcohol and drug histories not on file.  History reviewed. No pertinent family history.  Review of Systems:  Constitutional: Denies fever, chills, diaphoresis, appetite change and fatigue.  HEENT: Denies photophobia, eye pain, redness,  hearing loss, ear pain, congestion, sore throat, rhinorrhea, sneezing, mouth sores, trouble swallowing, neck pain, neck stiffness and tinnitus.   Respiratory: Denies chest tightness,  and wheezing.   Cardiovascular: Denies chest pain, palpitations and leg swelling.  Gastrointestinal: Denies nausea, vomiting, abdominal pain, diarrhea, constipation, blood in stool and abdominal distention.  Genitourinary: Denies dysuria, hematuria, flank pain and difficulty urinating.  Musculoskeletal: Denies myalgias, back pain, joint swelling, arthralgias  and gait problem.  Skin: Denies pallor, rash and wound.  Neurological: Denies dizziness, seizures, syncope, weakness, light-headedness, numbness and headaches.  Hematological: Denies adenopathy. Easy bruising, personal or family bleeding history  Psychiatric/Behavioral: Denies suicidal ideation, mood changes, confusion, nervousness, sleep disturbance and agitation  Physical Exam:  Filed Vitals:   09/21/11 1702 09/21/11 1922 09/21/11 1925  BP: 159/65 159/74 92/58  Pulse: 62 64 70  Temp:  97.9 F (36.6 C)   TempSrc:  Oral   Resp: 18 19 18   Height: 5\' 10"  (1.778 m)    SpO2: 100% 100% 95%    Constitutional: Vital signs reviewed.  Patient is in no acute distress and cooperative with exam. Alert and oriented x3. Morbidly obese. Head: Normocephalic and atraumatic Ear: TM normal bilaterally Mouth: no erythema or exudates, MMM Eyes: PERRL, EOMI, conjunctivae normal, No scleral icterus.  Neck: Supple, Trachea midline normal ROM, No JVD, mass, thyromegaly, or carotid bruit present.  Cardiovascular: RRR, S1 normal, S2 normal, no MRG, pulses symmetric and intact bilaterally Pulmonary/Chest: CTAB, no wheezes, rales, or rhonchi, decreased sounds at bases.  Abdominal: Soft. Non-tender, non-distended, bowel sounds are normal, no masses, organomegaly, or guarding present.  GU: no CVA tenderness Musculoskeletal: No joint deformities, erythema, or stiffness, ROM full and no nontender Ext: trace bilateral edema, no cyanosis, pulses palpable bilaterally (DP and PT), chronic venous stasis changes Hematology: no cervical, inginal, or axillary adenopathy.  Neurological: A&O x3, Strenght is normal and symmetric bilaterally, cranial nerve II-XII are grossly intact, no focal motor deficit, sensory intact to light touch bilaterally.  Skin: Warm, dry and intact. Right buttock area wound, 1 inch round with draining pus and with surrounding erythema, tenderness to palpation, no blood noted, no visible  bone Psychiatric: Normal mood and affect. speech and behavior is normal. Judgment and thought content normal. Cognition and memory are normal.   Labs on Admission:  Results for orders placed during the hospital encounter of 09/21/11 (from the past 48 hour(s))  CBC     Status: Abnormal   Collection Time   09/21/11  5:43 PM      Component Value Range Comment   WBC 9.5  4.0 - 10.5 (K/uL)    RBC 3.33 (*) 4.22 - 5.81 (MIL/uL)    Hemoglobin 9.9 (*) 13.0 - 17.0 (g/dL)    HCT 16.1 (*) 09.6 - 52.0 (%)    MCV 84.7  78.0 - 100.0 (fL)    MCH 29.7  26.0 - 34.0 (pg)    MCHC 35.1  30.0 - 36.0 (g/dL)    RDW 04.5  40.9 - 81.1 (%)    Platelets 217  150 - 400 (K/uL)   BASIC METABOLIC PANEL     Status: Abnormal   Collection Time   09/21/11  5:43 PM      Component Value Range Comment   Sodium 127 (*) 135 - 145 (mEq/L)    Potassium 4.0  3.5 - 5.1 (mEq/L)    Chloride 97  96 - 112 (mEq/L)    CO2 16 (*) 19 - 32 (mEq/L)    Glucose,  Bld 487 (*) 70 - 99 (mg/dL)    BUN 39 (*) 6 - 23 (mg/dL)    Creatinine, Ser 5.62 (*) 0.50 - 1.35 (mg/dL)    Calcium 8.0 (*) 8.4 - 10.5 (mg/dL)    GFR calc non Af Amer 15 (*) >90 (mL/min)    GFR calc Af Amer 17 (*) >90 (mL/min)   PRO B NATRIURETIC PEPTIDE     Status: Abnormal   Collection Time   09/21/11  5:46 PM      Component Value Range Comment   Pro B Natriuretic peptide (BNP) 2501.0 (*) 0 - 125 (pg/mL)   TROPONIN I     Status: Normal   Collection Time   09/21/11  5:46 PM      Component Value Range Comment   Troponin I <0.30  <0.30 (ng/mL)     Radiological Exams on Admission:  CXR: 09/21/2011 IMPRESSION:  1. No definite radiographic evidence of acute cardiopulmonary disease.  2. Mild cardiomegaly is unchanged.  3. Postoperative and post procedural changes, as above.   Assessment/Plan  Shortness of breath - this is most likely multifactorial in nature, COPS, morbid obesity hypoventilation syndrome, unlikely cardiac etiology - I am not able to appreciate any  significant volume overload on the physical exam - will continue pt's home medication nebulizers as needed and scheduled - continue to monitor vitals per floor protocol on telemetry - given medical non compliance and multiple risk factors including uncontrolled diabetes, HTN, HLD, age will cycle CE's and work on ruling out ACS - for now continue to provide supportive care with continuous oxygen via [place] and CPAP at night time  - will check 2 D ECHO  Acute Renal Failure on chronic renal failure - review of record indicate that pt baseline Cr since 2008 was ! 1.5 - 2.0 and this is new acute elevation - I suspect this is likely secondary to pre-renal etiology, dehydration in the setting of hyperglycemia - since no over signs of volume overload, will hold off on lasix and provide gentle hydration - please note that pt's last Myoview was done in 2008 and has shown EF of 58% - will repeat 2 D  ECHO - obtain renal US, urine sodium and creatinine - may consider ANA, complement levels if no response to IVF and the above work up unrevealing  Metabolic acidosis with mild elevation in anion gap - secondary to dehydration and ARF - will provide IVF for now - BMP in AM  HTN - will continue home BP medication regimen but will hold Altace in the setting of this new ARF finding - will hold off on any nephrotoxic agents - monitor vitals per floor protocol  Hyponatremia - this is somewhat chronic in nature with baseline NA 128 - 130 - will hydrate gently and will obtain BMP in AM  Diabetes Mellitus, uncontrolled with complications - check A1C - start Lantus 20 units BID as per home regimen - SSI, moderate coverage - adjust the regimen as indicated  Right buttock wound - wound care consult - diabetes control - empiric antibiotics  Hypothyroidism - check TSH - continue Synthroid  Anemia - of chronic disease - Hg at baseline ~10, this appears to be stable at this time - CBC in AM  DVT  Prophylaxis - SCD's  Code Status - Full  Education  - test results and diagnostic studies were discussed with patient  - patient verbalized the understanding - questions were answered at the bedside and contact information was  provided for additional questions or concerns  Time Spent on Admission: Over 30 minutes  MAGICK-Jeancarlo Leffler 09/21/2011, 9:46 PM  [facility] Pager # [phone] Main Office # [phone]

## 2011-09-21 NOTE — ED Notes (Signed)
Pt given sprite zero and waiting on a [place] sandwich. Ok per [place], [place]

## 2011-09-21 NOTE — ED Notes (Signed)
Patient's daughter:  [phone] Brett Fairy Presnell [phone]

## 2011-09-22 ENCOUNTER — Inpatient Hospital Stay (HOSPITAL_COMMUNITY): Payer: Medicare Other

## 2011-09-22 ENCOUNTER — Encounter (HOSPITAL_COMMUNITY): Payer: Self-pay | Admitting: Family Medicine

## 2011-09-22 DIAGNOSIS — L03317 Cellulitis of buttock: Secondary | ICD-10-CM

## 2011-09-22 DIAGNOSIS — L0231 Cutaneous abscess of buttock: Secondary | ICD-10-CM

## 2011-09-22 LAB — BASIC METABOLIC PANEL
BUN: 39 mg/dL — ABNORMAL HIGH (ref 6–23)
Chloride: 103 mEq/L (ref 96–112)
Creatinine, Ser: 3.7 mg/dL — ABNORMAL HIGH (ref 0.50–1.35)
GFR calc Af Amer: 17 mL/min — ABNORMAL LOW (ref >90)
Glucose, Bld: 219 mg/dL — ABNORMAL HIGH (ref 70–99)

## 2011-09-22 LAB — HEMOGLOBIN A1C
Hgb A1c MFr Bld: 10.7 % — ABNORMAL HIGH (ref <5.7)
Mean Plasma Glucose: 260 mg/dL — ABNORMAL HIGH (ref <117)

## 2011-09-22 LAB — CBC
HCT: 27.7 % — ABNORMAL LOW (ref 39.0–52.0)
MCH: 30 pg (ref 26.0–34.0)
MCHC: 35.4 g/dL (ref 30.0–36.0)
MCV: 84.7 fL (ref 78.0–100.0)
RDW: 13.2 % (ref 11.5–15.5)

## 2011-09-22 LAB — GLUCOSE, CAPILLARY
Glucose-Capillary: 186 mg/dL — ABNORMAL HIGH (ref 70–99)
Glucose-Capillary: 253 mg/dL — ABNORMAL HIGH (ref 70–99)
Glucose-Capillary: 218 mg/dL — ABNORMAL HIGH (ref 70–99)
Glucose-Capillary: 247 mg/dL — ABNORMAL HIGH (ref 70–99)

## 2011-09-22 LAB — CARDIAC PANEL(CRET KIN+CKTOT+MB+TROPI)
CK, MB: 2.3 ng/mL (ref 0.3–4.0)
Total CK: 64 U/L (ref 7–232)
Troponin I: <0.3 ng/mL (ref <0.30)
Troponin I: <0.3 ng/mL (ref <0.30)

## 2011-09-22 MED ORDER — VANCOMYCIN HCL 1000 MG IV SOLR
2500.0000 mg | Freq: Once | INTRAVENOUS | Status: AC
  Administered 2011-09-22: 2500 mg via INTRAVENOUS
  Filled 2011-09-22: qty 2500

## 2011-09-22 MED ORDER — SODIUM CHLORIDE 0.9 % IV SOLN
2000.0000 mg | INTRAVENOUS | Status: DC
  Administered 2011-09-24 – 2011-10-02 (×5): 2000 mg via INTRAVENOUS
  Filled 2011-09-22 (×6): qty 2000

## 2011-09-22 NOTE — Consult Note (Signed)
Reason for Consult: [name]: [name] is an 76 y.o. male.  HPI: This 76 year old gentleman who apparently lives on his own. His daughter called Dr. August Adams reporting had increasing shortness of breath and sugar was difficult to control. He has a history of medical noncompliance.  He was directed to the emergency room and [facility] were is admitted by Dr. August Adams. On admission he also complained of a sore spot on his right Austin. on admission he had a BNP of 2501. CK troponins were negative x3. Glucose was 260. Creatinine 3.7 with a slightly elevated white count 11,800. Medical management was initiated he was started on IV vancomycin and we were asked to see in consultation for the abscess on his right Austin.    Past Medical History  Diagnosis Date  . Diabetes mellitus Not taking medicines properly or monitoring sugar.  . CHF (congestive heart failure)/ Hx of MI/ CABG x 5   . Chronic airway obstruction, not elsewhere classified He uses O2 at night with CPAP.  He doesn't use O2 any other time, but thinks he should. On Inhaler  . Osteoarthrosis, unspecified whether generalized or localized, unspecified site   . Chronic kidney disease, stage III (moderate)   . Obesity, unspecified SLEEP APNEA   With O2  BMI 48  . Osteoarthrosis, unspecified whether generalized or localized, unspecified site   . Osteoporosis   . Depression   . Irregular heart beat PVD  Left CE at time of CABG Hypothyroid on supplement     Past Surgical History  Procedure Date  . Knee surgery left  . Back surgery   . Cardiac surgery     CABG X 5 Left Carotid     History reviewed. No pertinent family history.  Social History:  reports that he has never smoked. He has never used smokeless tobacco. He reports that he drinks alcohol. He reports that he does not use illicit drugs. 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  Allergies:  Allergies  Allergen Reactions    . Penicillins     Medications:  Prior to Admission:  Prescriptions prior to admission  Medication Sig Dispense Refill  . albuterol (PROVENTIL HFA;VENTOLIN HFA) 108 (90 BASE) MCG/ACT inhaler Inhale 2 puffs into the lungs every 6 (six) hours as needed. For shortness of breath.      Marland Kitchen aspirin EC 81 MG tablet Take 81 mg by mouth daily.      . budesonide-formoterol (SYMBICORT) 160-4.5 MCG/ACT inhaler Inhale 2 puffs into the lungs 2 (two) times daily.      . carvedilol (COREG) 25 MG tablet Take 25 mg by mouth 2 (two) times daily with a meal.      . furosemide (LASIX) 40 MG tablet Take 40 mg by mouth daily.       . insulin glargine (LANTUS) 100 UNIT/ML injection Inject 20 Units into the skin 2 (two) times daily.       Marland Kitchen levalbuterol (XOPENEX) 1.25 MG/3ML nebulizer solution Take 1.25 mg by nebulization every 4 (four) hours as needed. For shortness of breath.      . levothyroxine (SYNTHROID, LEVOTHROID) 50 MCG tablet Take 50 mcg by mouth daily.      Marland Kitchen lovastatin (MEVACOR) 40 MG tablet Take 40 mg by mouth 2 (two) times daily.      . ramipril (ALTACE) 10 MG capsule Take 10 mg by mouth 2 (two) times daily.      . sitaGLIPtin (JANUVIA) 100  MG tablet Take 100 mg by mouth daily.      . Tamsulosin HCl (FLOMAX) 0.4 MG CAPS Take 0.4 mg by mouth daily.       Scheduled:   . aspirin EC  81 mg Oral Daily  . budesonide-formoterol  2 puff Inhalation BID  . carvedilol  25 mg Oral BID WC  . insulin aspart  0-15 Units Subcutaneous Q4H  . insulin aspart  15 Units Subcutaneous Once  . insulin glargine  20 Units Subcutaneous BID  . levothyroxine  50 mcg Oral QAC breakfast  . linagliptin  5 mg Oral Daily  . simvastatin  10 mg Oral q1800  . sodium chloride  3 mL Intravenous Q12H  . Tamsulosin HCl  0.4 mg Oral Daily  . vancomycin  2,000 mg Intravenous Q48H  . vancomycin  2,500 mg Intravenous Once  . DISCONTD: albuterol  2.5 mg Nebulization Q6H  . DISCONTD: insulin regular  15 Units Intravenous Once    Continuous:   . sodium chloride 75 mL/hr at 09/22/11 0230   YNW:[id number], albuterol, HYDROcodone-acetaminophen Anti-infectives     Start     Dose/Rate Route Frequency Ordered Stop   09/24/11 0000   vancomycin (VANCOCIN) 2,000 mg in sodium chloride 0.9 % 500 mL IVPB        2,000 mg 250 mL/hr over 120 Minutes Intravenous Every 48 hours 09/22/11 0039     09/22/11 0115   vancomycin (VANCOCIN) 2,500 mg in sodium chloride 0.9 % 500 mL IVPB        2,500 mg 250 mL/hr over 120 Minutes Intravenous  Once 09/22/11 0039 09/22/11 0338          Results for orders placed during the hospital encounter of 09/21/11 (from the past 48 hour(s))  CBC     Status: Abnormal   Collection Time   09/21/11  5:43 PM      Component Value Range Comment   WBC 9.5  4.0 - 10.5 (K/uL)    RBC 3.33 (*) 4.22 - 5.81 (MIL/uL)    Hemoglobin 9.9 (*) 13.0 - 17.0 (g/dL)    HCT 65.7 (*) 84.6 - 52.0 (%)    MCV 84.7  78.0 - 100.0 (fL)    MCH 29.7  26.0 - 34.0 (pg)    MCHC 35.1  30.0 - 36.0 (g/dL)    RDW 96.2  95.2 - 84.1 (%)    Platelets 217  150 - 400 (K/uL)   BASIC METABOLIC PANEL     Status: Abnormal   Collection Time   09/21/11  5:43 PM      Component Value Range Comment   Sodium 127 (*) 135 - 145 (mEq/L)    Potassium 4.0  3.5 - 5.1 (mEq/L)    Chloride 97  96 - 112 (mEq/L)    CO2 16 (*) 19 - 32 (mEq/L)    Glucose, Bld 487 (*) 70 - 99 (mg/dL)    BUN 39 (*) 6 - 23 (mg/dL)    Creatinine, Ser 3.24 (*) 0.50 - 1.35 (mg/dL)    Calcium 8.0 (*) 8.4 - 10.5 (mg/dL)    GFR calc non Af Amer 15 (*) >90 (mL/min)    GFR calc Af Amer 17 (*) >90 (mL/min)   PRO B NATRIURETIC PEPTIDE     Status: Abnormal   Collection Time   09/21/11  5:46 PM      Component Value Range Comment   Pro B Natriuretic peptide (BNP) 2501.0 (*) 0 - 125 (pg/mL)   TROPONIN I  Status: Normal   Collection Time   09/21/11  5:46 PM      Component Value Range Comment   Troponin I <0.30  <0.30 (ng/mL)   HEPATIC FUNCTION PANEL     Status: Abnormal    Collection Time   09/21/11 10:05 PM      Component Value Range Comment   Total Protein 8.1  6.0 - 8.3 (g/dL)    Albumin 3.0 (*) 3.5 - 5.2 (g/dL)    AST <5  0 - 37 (U/L) REPEATED TO VERIFY   ALT 8  0 - 53 (U/L)    Alkaline Phosphatase 156 (*) 39 - 117 (U/L)    Total Bilirubin 0.4  0.3 - 1.2 (mg/dL)    Bilirubin, Direct 0.1  0.0 - 0.3 (mg/dL)    Indirect Bilirubin 0.3  0.3 - 0.9 (mg/dL)   MAGNESIUM     Status: Normal   Collection Time   09/21/11 10:05 PM      Component Value Range Comment   Magnesium 1.8  1.5 - 2.5 (mg/dL)   PHOSPHORUS     Status: Normal   Collection Time   09/21/11 10:05 PM      Component Value Range Comment   Phosphorus 3.9  2.3 - 4.6 (mg/dL)   TSH     Status: Normal   Collection Time   09/21/11 10:05 PM      Component Value Range Comment   TSH 1.562  0.350 - 4.500 (uIU/mL)   HEMOGLOBIN A1C     Status: Abnormal   Collection Time   09/21/11 10:05 PM      Component Value Range Comment   Hemoglobin A1C 10.7 (*) <5.7 (%)    Mean Plasma Glucose 260 (*) <117 (mg/dL)   CARDIAC PANEL(CRET KIN+CKTOT+MB+TROPI)     Status: Normal   Collection Time   09/21/11 10:05 PM      Component Value Range Comment   Total CK 74  7 - 232 (U/L)    CK, MB 2.3  0.3 - 4.0 (ng/mL)    Troponin I <0.30  <0.30 (ng/mL)    Relative Index RELATIVE INDEX IS INVALID  0.0 - 2.5    URINALYSIS, ROUTINE W REFLEX MICROSCOPIC     Status: Abnormal   Collection Time   09/21/11 10:19 PM      Component Value Range Comment   Color, Urine YELLOW  YELLOW     APPearance CLEAR  CLEAR     Specific Gravity, Urine 1.019  1.005 - 1.030     pH 6.0  5.0 - 8.0     Glucose, UA 250 (*) NEGATIVE (mg/dL)    Hgb urine dipstick SMALL (*) NEGATIVE     Bilirubin Urine NEGATIVE  NEGATIVE     Ketones, ur NEGATIVE  NEGATIVE (mg/dL)    Protein, ur >161 (*) NEGATIVE (mg/dL)    Urobilinogen, UA 1.0  0.0 - 1.0 (mg/dL)    Nitrite NEGATIVE  NEGATIVE     Leukocytes, UA NEGATIVE  NEGATIVE    SODIUM, URINE, RANDOM     Status:  Normal   Collection Time   09/21/11 10:19 PM      Component Value Range Comment   Sodium, Ur 67     URINE MICROSCOPIC-ADD ON     Status: Abnormal   Collection Time   09/21/11 10:19 PM      Component Value Range Comment   Squamous Epithelial / LPF RARE  RARE     WBC, UA 11-20  <3 (WBC/hpf)  WBC CLUMPS   RBC / HPF 0-2  <3 (RBC/hpf)    Bacteria, UA MANY (*) RARE     Urine-Other MUCOUS PRESENT     CREATININE, URINE, RANDOM     Status: Normal   Collection Time   09/21/11 10:28 PM      Component Value Range Comment   Creatinine, Urine 52.0     GLUCOSE, CAPILLARY     Status: Abnormal   Collection Time   09/22/11  1:10 AM      Component Value Range Comment   Glucose-Capillary 286 (*) 70 - 99 (mg/dL)    Comment 1 Notify RN     GLUCOSE, CAPILLARY     Status: Abnormal   Collection Time   09/22/11  5:33 AM      Component Value Range Comment   Glucose-Capillary 204 (*) 70 - 99 (mg/dL)    Comment 1 Notify RN     CARDIAC PANEL(CRET KIN+CKTOT+MB+TROPI)     Status: Normal   Collection Time   09/22/11  6:40 AM      Component Value Range Comment   Total CK 63  7 - 232 (U/L)    CK, MB 2.2  0.3 - 4.0 (ng/mL)    Troponin I <0.30  <0.30 (ng/mL)    Relative Index RELATIVE INDEX IS INVALID  0.0 - 2.5    BASIC METABOLIC PANEL     Status: Abnormal   Collection Time   09/22/11  6:40 AM      Component Value Range Comment   Sodium 133 (*) 135 - 145 (mEq/L)    Potassium 3.6  3.5 - 5.1 (mEq/L)    Chloride 103  96 - 112 (mEq/L)    CO2 18 (*) 19 - 32 (mEq/L)    Glucose, Bld 219 (*) 70 - 99 (mg/dL)    BUN 39 (*) 6 - 23 (mg/dL)    Creatinine, Ser 4.09 (*) 0.50 - 1.35 (mg/dL)    Calcium 8.7  8.4 - 10.5 (mg/dL)    GFR calc non Af Amer 15 (*) >90 (mL/min)    GFR calc Af Amer 17 (*) >90 (mL/min)   CBC     Status: Abnormal   Collection Time   09/22/11  6:40 AM      Component Value Range Comment   WBC 11.8 (*) 4.0 - 10.5 (K/uL)    RBC 3.27 (*) 4.22 - 5.81 (MIL/uL)    Hemoglobin 9.8 (*) 13.0 - 17.0 (g/dL)     HCT 81.1 (*) 91.4 - 52.0 (%)    MCV 84.7  78.0 - 100.0 (fL)    MCH 30.0  26.0 - 34.0 (pg)    MCHC 35.4  30.0 - 36.0 (g/dL)    RDW 78.2  95.6 - 21.3 (%)    Platelets 210  150 - 400 (K/uL)   GLUCOSE, CAPILLARY     Status: Abnormal   Collection Time   09/22/11  7:50 AM      Component Value Range Comment   Glucose-Capillary 186 (*) 70 - 99 (mg/dL)   GLUCOSE, CAPILLARY     Status: Abnormal   Collection Time   09/22/11 12:17 PM      Component Value Range Comment   Glucose-Capillary 253 (*) 70 - 99 (mg/dL)   CARDIAC PANEL(CRET KIN+CKTOT+MB+TROPI)     Status: Normal   Collection Time   09/22/11  1:50 PM      Component Value Range Comment   Total CK 64  7 - 232 (U/L)  CK, MB 2.3  0.3 - 4.0 (ng/mL)    Troponin I <0.30  <0.30 (ng/mL)    Relative Index RELATIVE INDEX IS INVALID  0.0 - 2.5      Dg Chest 2 View  09/21/2011  *RADIOLOGY REPORT*  Clinical Data: Shortness of breath.  CHEST - 2 VIEW  Comparison: Chest x-ray 03/15/2007.  Findings: Lung volumes are normal.  No consolidative airspace disease.  No definite pleural effusions (lateral projection is limited by lack of visualization of the inferior costophrenic sulci).  Pulmonary vasculature is normal.  Mild cardiomegaly is unchanged.  Mediastinal contours are unremarkable. The patient is status post median sternotomyfor CABG. Atherosclerotic calcifications are noted within the arch of the aorta.  A small stent is in place, likely within the left anterior descending coronary artery.  IMPRESSION: 1.  No definite radiographic evidence of acute cardiopulmonary disease. 2.  Mild cardiomegaly is unchanged. 3.  Postoperative and post procedural changes, as above.  Original Report Authenticated By: Florencia Reasons, M.D.   US Renal  09/22/2011  *RADIOLOGY REPORT*  Clinical Data: Acute renal failure.  RENAL/URINARY TRACT ULTRASOUND COMPLETE  Comparison:  None  Findings:  Right Kidney:  12.7 cm.  No hydronephrosis.  Normal echotexture. Suspect slight  cortical thinning.  Visualization is difficult due to body habitus.  No visible focal abnormality.  Left Kidney:  12.2 cm.  Normal echotexture.  No hydronephrosis. Suspect mild cortical thinning.  Again, visualization is difficult due to body habitus.  The  Bladder:  Grossly unremarkable.  IMPRESSION: Limited visualization due to body habitus.  No hydronephrosis. Suspect mild cortical thinning.  Original Report Authenticated By: Cyndie Chime, M.D.    Review of Systems  Constitutional: Negative.   HENT: Negative.   Eyes: Negative.   Respiratory: Positive for shortness of breath (DOE) and wheezing (chronic). Negative for cough, hemoptysis and sputum production.        Has a CPAP WITH O2 at home for sleep apnea.  Cardiovascular: Negative.   Gastrointestinal: Positive for heartburn. Negative for nausea, vomiting, abdominal pain, diarrhea, constipation and blood in stool.  Genitourinary:       Bph, on Flomax.  Musculoskeletal: Negative.   Skin:       Sore area on Right Austin for 1 week  Neurological: Negative.   Endo/Heme/Allergies: Negative.   Psychiatric/Behavioral: Negative.    Blood pressure 159/69, pulse 55, temperature 98.7 F (37.1 C), temperature source Oral, resp. rate 20, height 5\' 10"  (1.778 m), weight 150.8 kg (332 lb 7.3 oz), SpO2 99.00%. Physical Exam  Constitutional: He is oriented to person, place, and time. He appears well-developed and well-nourished. No distress.       Obese 70 inc, 335 lbs BMI 48.1.  NAD  HENT:  Head: Normocephalic and atraumatic.  Right Ear: External ear normal.  Left Ear: External ear normal.  Eyes: Conjunctivae and EOM are normal. Pupils are equal, round, and reactive to light. Left eye exhibits no discharge. No scleral icterus.  Neck: Normal range of motion. Neck supple. No JVD present. No tracheal deviation present. No thyromegaly present.  Cardiovascular: Normal rate, regular rhythm and intact distal pulses.  Exam reveals no gallop.   Murmur  heard. Respiratory: Effort normal and breath sounds normal. No respiratory distress. He has no wheezes. He has no rales. He exhibits no tenderness.  GI: Soft. Bowel sounds are normal. He exhibits no distension and no mass. There is no tenderness. There is no rebound and no guarding.  Musculoskeletal: Normal range of  motion. He exhibits no edema and no tenderness.  Lymphadenopathy:    He has no cervical adenopathy.  Neurological: He is alert and oriented to person, place, and time. He has normal reflexes. No cranial nerve deficit.  Skin: No rash noted. He is not diaphoretic. There is erythema. No pallor.       10 X 6 cm area right lateral Austin.  He has some skin loss in the mid portion about 5-6 cm in diameter.  The area under this is fluctuant and Needs drainage.  Psychiatric: He has a normal mood and affect. His behavior is normal. Judgment and thought content normal.    Assessment/Plan: 1. Right Austin abscess 2. Insulin-dependent diabetes mellitus. Type II 3. Congestive heart failure; BNP 2500 3. History of MI/status post CABG x5. 4. COPD 5. Sleep apnea with CPAP and 02. 6. Chronic and acute renal failure/ stage III disease 7. Obesity BMI of 48 8. Peripheral vascular disease status post left carotid endarterectomy 9. History of prior back surgery; currently asymptomatic. 10. status post left knee surgery 11. Osteoarthritis 12. History of depression.  Plan: Patient's had lunch. He has significant COPD, congestive heart failure, and sleep apnea.  I believe the safest avenue is to take him to the OR for incision and drainage of this site on his right Austin. [facility] assistant for Dr. Cyndia Bent.   Shaynah Hund 09/22/2011, 3:16 PM

## 2011-09-22 NOTE — Progress Notes (Signed)
Inpatient Diabetes Program Recommendations  AACE/ADA: New Consensus Statement on Inpatient Glycemic Control (2009)  Target Ranges:  Prepandial:   less than 140 mg/dL      Peak postprandial:   less than 180 mg/dL (1-2 hours)      Critically ill patients:  140 - 180 mg/dL   Reason for Visit: Hyperglycemia Results for [name], [name] (MRN [id number]) as of 09/22/2011 10:16  Ref. Range 09/22/2011 06:40  Sodium Latest Range: 135-145 mEq/L 133 (L)  Potassium Latest Range: 3.5-5.1 mEq/L 3.6  Chloride Latest Range: 96-112 mEq/L 103  CO2 Latest Range: 19-32 mEq/L 18 (L)  BUN Latest Range: 6-23 mg/dL 39 (H)  Creat Latest Range: 0.50-1.35 mg/dL 5.62 (H)  Calcium Latest Range: 8.4-10.5 mg/dL 8.7  GFR calc non Af Amer Latest Range: >90 mL/min 15 (L)  GFR calc Af Amer Latest Range: >90 mL/min 17 (L)  Glucose Latest Range: 70-99 mg/dL 130 (H)  Results for [name], [name] (MRN [id number]) as of 09/22/2011 10:16  Ref. Range 09/22/2011 01:10 09/22/2011 05:33 09/22/2011 07:50  Glucose-Capillary Latest Range: 70-99 mg/dL 295 (H) 284 (H) 132 (H)   Results for [name], [name] (MRN [id number]) as of 09/22/2011 10:16  Ref. Range 09/21/2011 22:05  Hemoglobin A1C Latest Range: <5.7 % 10.7 (H)    Inpatient Diabetes Program Recommendations Insulin - Basal: Increase Lantus to 25 units bid Correction (SSI): Continue Q 4 until pt is eating well, then advance to tidwc and hs Insulin - Meal Coverage: Will probably need meal coverage insulin.  Recommend 4 units tidwc when po intake increases HgbA1C: 10.7% - uncontrolled Outpatient Referral: OP Diabetes Education consult for morbid obesity and uncontrolled DM  Note: Will follow.

## 2011-09-22 NOTE — Consult Note (Addendum)
WOC consult Note Reason for Consult: eval wound R buttock. Pt presented with worsening of wound on the right buttock, he reports poor glycemic control and worsening SOB at home. He reports he has not had any types of wounds like this before. Sits on the couch a lot and stated "pain was getting worse".   Wound type: wound, unclear at this time etiology but has surrounding induration *(extends 10cm circumferentially) painful, partial thickness skin loss with 100% yellow slough center and associated erythema (extends 3-4cm circumferentially) Measurement: open wound is 3cm x 4cm x0.2cm  Wound bed: 100% yellow, probed  wound it is not fluctuant in the wound and I could not open this area further.  Drainage (amount, consistency, odor) moderate yellow, no odor but purulent.   Periwound: see above note Dressing procedure/placement/frequency: will continue silicone foam dressing for management of exudate.  I believe this area needs to be evaluated by surgery for possible I and D, with the associated pain, induration and erthyma explained that opening and drainage of this area may be needed but surgery would eval. May resolve with abtx but due to pain and amount of drainage I&D may provide more relief and speed of healing of this area.   Will contact admitting MD for referral to surgery  Re consult if needed, will not follow at this time. Thanks  Pryce Folts [employer], CWOCN [phone])

## 2011-09-22 NOTE — Progress Notes (Signed)
Subjective:  History is noted. The patient acknowledges he had become more depressed over the past several weeks. He just stopped taking most of his medications. He is sitting on his sofa for prolonged periods of time. He denies chest pains or palpitations. He had experienced increasing exertional dyspnea. Denies fever or night sweats. He minimizes any chills. He presently feels better today. He still has some pain in her right buttock area. No chest pains. Denies dyspnea at rest but with mild dyspnea on exertion. Patient has not been compliant with his medical visits as well.   Allergies  Allergen Reactions  . Penicillins    Current Facility-Administered Medications  Medication Dose Route Frequency Provider Last Rate Last Dose  . 0.9 %  sodium chloride infusion   Intravenous Continuous Dorothea Ogle, MD 75 mL/hr at 09/22/11 0230    . albuterol (PROVENTIL HFA;VENTOLIN HFA) 108 (90 BASE) MCG/ACT inhaler 2 puff  2 puff Inhalation Q6H PRN Dorothea Ogle, MD      . albuterol (PROVENTIL) (5 MG/ML) 0.5% nebulizer solution 2.5 mg  2.5 mg Nebulization Q2H PRN Dorothea Ogle, MD   2.5 mg at 09/21/11 2238  . aspirin EC tablet 81 mg  81 mg Oral Daily Dorothea Ogle, MD   81 mg at 09/22/11 0929  . budesonide-formoterol (SYMBICORT) 160-4.5 MCG/ACT inhaler 2 puff  2 puff Inhalation BID Dorothea Ogle, MD   2 puff at 09/22/11 1212  . carvedilol (COREG) tablet 25 mg  25 mg Oral BID WC Dorothea Ogle, MD   25 mg at 09/22/11 0929  . HYDROcodone-acetaminophen (NORCO) 5-325 MG per tablet 1-2 tablet  1-2 tablet Oral Q4H PRN Dorothea Ogle, MD      . insulin aspart (novoLOG) injection 0-15 Units  0-15 Units Subcutaneous Q4H Dorothea Ogle, MD   8 Units at 09/22/11 1234  . insulin aspart (novoLOG) injection 15 Units  15 Units Subcutaneous Once Raeford Razor, MD   15 Units at 09/21/11 2019  . insulin glargine (LANTUS) injection 20 Units  20 Units Subcutaneous BID Dorothea Ogle, MD   20 Units at 09/22/11 [phone]  . levothyroxine  (SYNTHROID, LEVOTHROID) tablet 50 mcg  50 mcg Oral QAC breakfast Dorothea Ogle, MD   50 mcg at 09/22/11 0929  . linagliptin (TRADJENTA) tablet 5 mg  5 mg Oral Daily Dorothea Ogle, MD   5 mg at 09/22/11 0929  . simvastatin (ZOCOR) tablet 10 mg  10 mg Oral q1800 Dorothea Ogle, MD      . sodium chloride 0.9 % injection 3 mL  3 mL Intravenous Q12H Dorothea Ogle, MD      . Tamsulosin HCl [facility]) capsule 0.4 mg  0.4 mg Oral Daily Dorothea Ogle, MD   0.4 mg at 09/22/11 0929  . vancomycin (VANCOCIN) 2,000 mg in sodium chloride 0.9 % 500 mL IVPB  2,000 mg Intravenous Q48H Lorenza Evangelist, PHARMD      . vancomycin (VANCOCIN) 2,500 mg in sodium chloride 0.9 % 500 mL IVPB  2,500 mg Intravenous Once Lorenza Evangelist, PHARMD   2,500 mg at 09/22/11 0138  . DISCONTD: albuterol (PROVENTIL) (5 MG/ML) 0.5% nebulizer solution 2.5 mg  2.5 mg Nebulization Q6H Dorothea Ogle, MD      . DISCONTD: insulin regular (NOVOLIN R,HUMULIN R) 100 units/mL injection 15 Units  15 Units Intravenous Once Raeford Razor, MD        Objective: Blood pressure 159/69, pulse 55, temperature 98.7  F (37.1 C), temperature source Oral, resp. rate 20, height 5\' 10"  (1.778 m), weight 332 lb 7.3 oz (150.8 kg), SpO2 99.00%.  Well-developed overweight depressed appearing white male presently in no acute distress. HEENT: No sinus tenderness. No sclera icterus. NECK: No enlarged thyroid. No posterior cervical nodes. LUNGS: Distant breath sounds without wheezes. No vocal fremitus or rales. CV: Normal S1, S2 without S3. ABD: Obese. Nontender. MSK: Negative Homans. No edema. NEURO: Alert, oriented x3. Nonfocal. PSYCHIATRIC: Flat affect. Depressed mood. Denies suicidal ideations. SKIN: Right buttock wound presently sterilely dressed. As noted per wound care specialist.  Lab results: Results for orders placed during the hospital encounter of 09/21/11 (from the past 48 hour(s))  CBC     Status: Abnormal   Collection Time   09/21/11  5:43 PM       Component Value Range Comment   WBC 9.5  4.0 - 10.5 (K/uL)    RBC 3.33 (*) 4.22 - 5.81 (MIL/uL)    Hemoglobin 9.9 (*) 13.0 - 17.0 (g/dL)    HCT 16.1 (*) 09.6 - 52.0 (%)    MCV 84.7  78.0 - 100.0 (fL)    MCH 29.7  26.0 - 34.0 (pg)    MCHC 35.1  30.0 - 36.0 (g/dL)    RDW 04.5  40.9 - 81.1 (%)    Platelets 217  150 - 400 (K/uL)   BASIC METABOLIC PANEL     Status: Abnormal   Collection Time   09/21/11  5:43 PM      Component Value Range Comment   Sodium 127 (*) 135 - 145 (mEq/L)    Potassium 4.0  3.5 - 5.1 (mEq/L)    Chloride 97  96 - 112 (mEq/L)    CO2 16 (*) 19 - 32 (mEq/L)    Glucose, Bld 487 (*) 70 - 99 (mg/dL)    BUN 39 (*) 6 - 23 (mg/dL)    Creatinine, Ser 9.14 (*) 0.50 - 1.35 (mg/dL)    Calcium 8.0 (*) 8.4 - 10.5 (mg/dL)    GFR calc non Af Amer 15 (*) >90 (mL/min)    GFR calc Af Amer 17 (*) >90 (mL/min)   PRO B NATRIURETIC PEPTIDE     Status: Abnormal   Collection Time   09/21/11  5:46 PM      Component Value Range Comment   Pro B Natriuretic peptide (BNP) 2501.0 (*) 0 - 125 (pg/mL)   TROPONIN I     Status: Normal   Collection Time   09/21/11  5:46 PM      Component Value Range Comment   Troponin I <0.30  <0.30 (ng/mL)   HEPATIC FUNCTION PANEL     Status: Abnormal   Collection Time   09/21/11 10:05 PM      Component Value Range Comment   Total Protein 8.1  6.0 - 8.3 (g/dL)    Albumin 3.0 (*) 3.5 - 5.2 (g/dL)    AST <5  0 - 37 (U/L) REPEATED TO VERIFY   ALT 8  0 - 53 (U/L)    Alkaline Phosphatase 156 (*) 39 - 117 (U/L)    Total Bilirubin 0.4  0.3 - 1.2 (mg/dL)    Bilirubin, Direct 0.1  0.0 - 0.3 (mg/dL)    Indirect Bilirubin 0.3  0.3 - 0.9 (mg/dL)   MAGNESIUM     Status: Normal   Collection Time   09/21/11 10:05 PM      Component Value Range Comment   Magnesium 1.8  1.5 -  2.5 (mg/dL)   PHOSPHORUS     Status: Normal   Collection Time   09/21/11 10:05 PM      Component Value Range Comment   Phosphorus 3.9  2.3 - 4.6 (mg/dL)   TSH     Status: Normal   Collection  Time   09/21/11 10:05 PM      Component Value Range Comment   TSH 1.562  0.350 - 4.500 (uIU/mL)   HEMOGLOBIN A1C     Status: Abnormal   Collection Time   09/21/11 10:05 PM      Component Value Range Comment   Hemoglobin A1C 10.7 (*) <5.7 (%)    Mean Plasma Glucose 260 (*) <117 (mg/dL)   CARDIAC PANEL(CRET KIN+CKTOT+MB+TROPI)     Status: Normal   Collection Time   09/21/11 10:05 PM      Component Value Range Comment   Total CK 74  7 - 232 (U/L)    CK, MB 2.3  0.3 - 4.0 (ng/mL)    Troponin I <0.30  <0.30 (ng/mL)    Relative Index RELATIVE INDEX IS INVALID  0.0 - 2.5    URINALYSIS, ROUTINE W REFLEX MICROSCOPIC     Status: Abnormal   Collection Time   09/21/11 10:19 PM      Component Value Range Comment   Color, Urine YELLOW  YELLOW     APPearance CLEAR  CLEAR     Specific Gravity, Urine 1.019  1.005 - 1.030     pH 6.0  5.0 - 8.0     Glucose, UA 250 (*) NEGATIVE (mg/dL)    Hgb urine dipstick SMALL (*) NEGATIVE     Bilirubin Urine NEGATIVE  NEGATIVE     Ketones, ur NEGATIVE  NEGATIVE (mg/dL)    Protein, ur >161 (*) NEGATIVE (mg/dL)    Urobilinogen, UA 1.0  0.0 - 1.0 (mg/dL)    Nitrite NEGATIVE  NEGATIVE     Leukocytes, UA NEGATIVE  NEGATIVE    SODIUM, URINE, RANDOM     Status: Normal   Collection Time   09/21/11 10:19 PM      Component Value Range Comment   Sodium, Ur 67     URINE MICROSCOPIC-ADD ON     Status: Abnormal   Collection Time   09/21/11 10:19 PM      Component Value Range Comment   Squamous Epithelial / LPF RARE  RARE     WBC, UA 11-20  <3 (WBC/hpf) WBC CLUMPS   RBC / HPF 0-2  <3 (RBC/hpf)    Bacteria, UA MANY (*) RARE     Urine-Other MUCOUS PRESENT     CREATININE, URINE, RANDOM     Status: Normal   Collection Time   09/21/11 10:28 PM      Component Value Range Comment   Creatinine, Urine 52.0     GLUCOSE, CAPILLARY     Status: Abnormal   Collection Time   09/22/11  1:10 AM      Component Value Range Comment   Glucose-Capillary 286 (*) 70 - 99 (mg/dL)     Comment 1 Notify RN     GLUCOSE, CAPILLARY     Status: Abnormal   Collection Time   09/22/11  5:33 AM      Component Value Range Comment   Glucose-Capillary 204 (*) 70 - 99 (mg/dL)    Comment 1 Notify RN     CARDIAC PANEL(CRET KIN+CKTOT+MB+TROPI)     Status: Normal   Collection Time   09/22/11  6:40  AM      Component Value Range Comment   Total CK 63  7 - 232 (U/L)    CK, MB 2.2  0.3 - 4.0 (ng/mL)    Troponin I <0.30  <0.30 (ng/mL)    Relative Index RELATIVE INDEX IS INVALID  0.0 - 2.5    BASIC METABOLIC PANEL     Status: Abnormal   Collection Time   09/22/11  6:40 AM      Component Value Range Comment   Sodium 133 (*) 135 - 145 (mEq/L)    Potassium 3.6  3.5 - 5.1 (mEq/L)    Chloride 103  96 - 112 (mEq/L)    CO2 18 (*) 19 - 32 (mEq/L)    Glucose, Bld 219 (*) 70 - 99 (mg/dL)    BUN 39 (*) 6 - 23 (mg/dL)    Creatinine, Ser 4.09 (*) 0.50 - 1.35 (mg/dL)    Calcium 8.7  8.4 - 10.5 (mg/dL)    GFR calc non Af Amer 15 (*) >90 (mL/min)    GFR calc Af Amer 17 (*) >90 (mL/min)   CBC     Status: Abnormal   Collection Time   09/22/11  6:40 AM      Component Value Range Comment   WBC 11.8 (*) 4.0 - 10.5 (K/uL)    RBC 3.27 (*) 4.22 - 5.81 (MIL/uL)    Hemoglobin 9.8 (*) 13.0 - 17.0 (g/dL)    HCT 81.1 (*) 91.4 - 52.0 (%)    MCV 84.7  78.0 - 100.0 (fL)    MCH 30.0  26.0 - 34.0 (pg)    MCHC 35.4  30.0 - 36.0 (g/dL)    RDW 78.2  95.6 - 21.3 (%)    Platelets 210  150 - 400 (K/uL)   GLUCOSE, CAPILLARY     Status: Abnormal   Collection Time   09/22/11  7:50 AM      Component Value Range Comment   Glucose-Capillary 186 (*) 70 - 99 (mg/dL)   GLUCOSE, CAPILLARY     Status: Abnormal   Collection Time   09/22/11 12:17 PM      Component Value Range Comment   Glucose-Capillary 253 (*) 70 - 99 (mg/dL)     Studies/Results: Dg Chest 2 View  09/21/2011  *RADIOLOGY REPORT*  Clinical Data: Shortness of breath.  CHEST - 2 VIEW  Comparison: Chest x-ray 03/15/2007.  Findings: Lung volumes are normal.  No  consolidative airspace disease.  No definite pleural effusions (lateral projection is limited by lack of visualization of the inferior costophrenic sulci).  Pulmonary vasculature is normal.  Mild cardiomegaly is unchanged.  Mediastinal contours are unremarkable. The patient is status post median sternotomyfor CABG. Atherosclerotic calcifications are noted within the arch of the aorta.  A small stent is in place, likely within the left anterior descending coronary artery.  IMPRESSION: 1.  No definite radiographic evidence of acute cardiopulmonary disease. 2.  Mild cardiomegaly is unchanged. 3.  Postoperative and post procedural changes, as above.  Original Report Authenticated By: Florencia Reasons, M.D.   US Renal  09/22/2011  *RADIOLOGY REPORT*  Clinical Data: Acute renal failure.  RENAL/URINARY TRACT ULTRASOUND COMPLETE  Comparison:  None  Findings:  Right Kidney:  12.7 cm.  No hydronephrosis.  Normal echotexture. Suspect slight cortical thinning.  Visualization is difficult due to body habitus.  No visible focal abnormality.  Left Kidney:  12.2 cm.  Normal echotexture.  No hydronephrosis. Suspect mild cortical thinning.  Again, visualization is difficult  due to body habitus.  The  Bladder:  Grossly unremarkable.  IMPRESSION: Limited visualization due to body habitus.  No hydronephrosis. Suspect mild cortical thinning.  Original Report Authenticated By: Cyndie Chime, M.D.    There is no problem list on file for this patient.   Impression: Respiratory insufficiency, multifactorial. Patient with COPD history, pulmonary hypertension. Acute renal insufficiency. No evidence for obstruction by renal ultrasound. Proteinuria noted. Infected right buttock ulcer. This may need I&D. Uncontrolled diabetes mellitus insulin requiring. Patient with elevated hemoglobin A1c as well. COPD with pulmonary hypertension. Pro BNP is 2500. Coronary artery disease history. Major depression. Patient has not been  compliant with his regimen as well. Obesity. History degenerative joint disease. Status post bilateral knee replacements. Rule out urinary tract infection. Culture pending.   Plan: Surgical consultation for I&D. Wound culture. Continue IV vancomycin. Continue fluid rehydration sliding scale insulin with close followup. Continue nebulizer therapy as well. Reinstitute his antidepressant. Followup renal functions. Nephrology consultation. SPEP, ANCA.   August Saucer, Brandyce Dimario 09/22/2011 2:07 PM

## 2011-09-22 NOTE — Evaluation (Signed)
Physical Therapy Evaluation One time eval and D/C from acute PT Patient Details Name: [name] MRN: [id number] DOB: Jul 31, 1935 Today's Date: 09/22/2011  Problem List: There is no problem list on file for this patient.   Past Medical History:  Past Medical History  Diagnosis Date  . Diabetes mellitus   . CHF (congestive heart failure)   . Chronic airway obstruction, not elsewhere classified   . Osteoarthrosis, unspecified whether generalized or localized, unspecified site   . Chronic kidney disease, stage III (moderate)   . Obesity, unspecified   . Osteoarthrosis, unspecified whether generalized or localized, unspecified site   . Osteoporosis   . Depression   . Irregular heart beat    Past Surgical History:  Past Surgical History  Procedure Date  . Knee surgery   . Back surgery   . Cardiac surgery     5 bipasses, carotid artery    PT Assessment/Plan/Recommendation PT Assessment Clinical Impression Statement: Pt admitted for renal failure.  Pt reports his mobility is at baseline.  Pt declined need for PT services.  Recommended pt use assistive device (has all DME) upon return home for safety and for decreasing knee pain.  Recommend nsg staff ambulate pt. PT Recommendation/Assessment: Patent does not need any further PT services No Skilled PT: Patient at baseline level of functioning PT Recommendation Follow Up Recommendations: No PT follow up Equipment Recommended: None recommended by PT PT Goals     PT Evaluation Precautions/Restrictions  Precautions Precautions: Fall Restrictions Weight Bearing Restrictions: No Prior Functioning  Home Living Lives With: Alone Type of Home: House Home Access: Stairs to enter [employer] of Steps: 1 Entrance Stairs-Rails: None Home Layout: One level Home Adaptive Equipment: Walker - Environmental health practitioner;Other (comment) (lift chair) Prior Function Level of Independence: Independent Comments:  Pt reports he is independent however has all DME if needed.  Pt also reports he fatigues quickly 2* COPD and takes rests often. Cognition Cognition Arousal/Alertness: Awake/alert Overall Cognitive Status: Appears within functional limits for tasks assessed Sensation/Coordination   Extremity Assessment RLE Strength RLE Overall Strength Comments: grossly at least 3/5 throughout LLE Strength LLE Overall Strength Comments: grossly at least 3/5 throughout Mobility (including Balance) Bed Mobility Bed Mobility: No Transfers Transfers: Yes Sit to Stand: 4: Min assist;From chair/3-in-1;With upper extremity assist Sit to Stand Details (indicate cue type and reason): pt uses momentum to stand, min/guard for safety Stand to Sit: 4: Min assist;To chair/3-in-1;With upper extremity assist Stand to Sit Details: min/guard for safety Ambulation/Gait Ambulation/Gait: Yes Ambulation/Gait Assistance: 4: Min assist Ambulation/Gait Assistance Details (indicate cue type and reason): min/guard, pt declined assistive device even though he reported pain in L knee,  pt states that his ambulation is at baseline as well as his fatigue level, pt did push IV pole Ambulation Distance (Feet): 40 Feet Assistive device: None Gait Pattern: Step-through pattern;Decreased stride length;Antalgic Gait velocity: decreased    Exercise    End of Session PT - End of Session Activity Tolerance: Patient tolerated treatment well Patient left: in chair;with call bell in reach General Behavior During Session: [facility] for tasks performed Cognition: [facility] for tasks performed  Macaulay Reicher,KATHrine E 09/22/2011, 11:49 AM Pager: [phone]

## 2011-09-22 NOTE — Consult Note (Signed)
Patient seen and examined.  Plan incision and drainage of right buttock abscess with local anesthesia in the lateral position tomorrow in the OR.  He is getting IV abxs.

## 2011-09-22 NOTE — Progress Notes (Signed)
  Echocardiogram 2D Echocardiogram has been performed.  Cathie Beams Deneen 09/22/2011, 12:28 PM

## 2011-09-22 NOTE — Progress Notes (Signed)
ANTIBIOTIC CONSULT NOTE - INITIAL  Pharmacy Consult for Vancomycin Indication: Right buttock area abscess  Allergies  Allergen Reactions  . Penicillins     Patient Measurements: Height: 5\' 10"  (177.8 cm) IBW/kg (Calculated) : 73    Vital Signs: Temp: 97.5 F (36.4 C) (04/12 0009) Temp src: Oral (04/12 0009) BP: 157/71 mmHg (04/12 0009) Pulse Rate: 69  (04/12 0009) Intake/Output from previous day: 04/11 0701 - 04/12 0700 In: -  Out: 450 [Urine:450] Intake/Output from this shift: Total I/O In: -  Out: 450 [Urine:450]  Labs:  [facility] 09/21/11 1743  WBC 9.5  HGB 9.9*  PLT 217  LABCREA --  CREATININE 3.78*   The CrCl is unknown because both a height and weight (above a minimum accepted value) are required for this calculation. No results found for this basename: VANCOTROUGH:2,VANCOPEAK:2,VANCORANDOM:2,GENTTROUGH:2,GENTPEAK:2,GENTRANDOM:2,TOBRATROUGH:2,TOBRAPEAK:2,TOBRARND:2,AMIKACINPEAK:2,AMIKACINTROU:2,AMIKACIN:2, in the last 72 hours   Microbiology: No results found for this or any previous visit (from the past 720 hour(s)).  Medical History: Past Medical History  Diagnosis Date  . Diabetes mellitus   . CHF (congestive heart failure)   . Chronic airway obstruction, not elsewhere classified   . Osteoarthrosis, unspecified whether generalized or localized, unspecified site   . Chronic kidney disease, stage III (moderate)   . Obesity, unspecified   . Osteoarthrosis, unspecified whether generalized or localized, unspecified site   . Osteoporosis   . Depression   . Irregular heart beat     Medications:  Scheduled:    . aspirin EC  81 mg Oral Daily  . budesonide-formoterol  2 puff Inhalation BID  . carvedilol  25 mg Oral BID WC  . insulin aspart  0-15 Units Subcutaneous Q4H  . insulin aspart  15 Units Subcutaneous Once  . insulin glargine  20 Units Subcutaneous BID  . levothyroxine  50 mcg Oral QAC breakfast  . linagliptin  5 mg Oral Daily  . simvastatin   10 mg Oral q1800  . sodium chloride  3 mL Intravenous Q12H  . Tamsulosin HCl  0.4 mg Oral Daily  . DISCONTD: albuterol  2.5 mg Nebulization Q6H  . DISCONTD: insulin regular  15 Units Intravenous Once   Infusions:    . sodium chloride     Assessment: 76 yo admitted with SOB, elevated CBGs and swelling of right buttock area.  Pt ordered Vancomcyin per RX.  Goal of Therapy:  Vancomycin trough level 15-20 mcg/ml  Plan:   Vancomycin 2500mg  IV x1 (load)  Then 2000mg  IV q48h . CrCl~18 (N)  F/U SCr/Levels.  As SCr improves- dosing will be adjusted accordingly.  Susanne Greenhouse R 09/22/2011,12:31 AM

## 2011-09-23 ENCOUNTER — Inpatient Hospital Stay (HOSPITAL_COMMUNITY): Payer: Medicare Other | Admitting: Anesthesiology

## 2011-09-23 ENCOUNTER — Encounter (HOSPITAL_COMMUNITY): Payer: Self-pay | Admitting: Anesthesiology

## 2011-09-23 ENCOUNTER — Encounter (HOSPITAL_COMMUNITY)
Admission: EM | Disposition: A | Discharge status: Home Health | Payer: Self-pay | Source: Home / Self Care | Attending: Internal Medicine

## 2011-09-23 LAB — URINE CULTURE: Colony Count: >=100000

## 2011-09-23 LAB — GLUCOSE, CAPILLARY
Glucose-Capillary: 150 mg/dL — ABNORMAL HIGH (ref 70–99)
Glucose-Capillary: 167 mg/dL — ABNORMAL HIGH (ref 70–99)
Glucose-Capillary: 224 mg/dL — ABNORMAL HIGH (ref 70–99)
Glucose-Capillary: 248 mg/dL — ABNORMAL HIGH (ref 70–99)

## 2011-09-23 SURGERY — INCISION AND DRAINAGE, ABSCESS
Anesthesia: Monitor Anesthesia Care | Site: Buttocks | Laterality: Right | Wound class: Dirty or Infected

## 2011-09-23 MED ORDER — MIDAZOLAM HCL 5 MG/5ML IJ SOLN
INTRAMUSCULAR | Status: DC | PRN
  Administered 2011-09-23: 2 mg via INTRAVENOUS

## 2011-09-23 MED ORDER — LIDOCAINE-EPINEPHRINE 1 %-1:100000 IJ SOLN
INTRAMUSCULAR | Status: DC | PRN
  Administered 2011-09-23: 30 mL

## 2011-09-23 MED ORDER — FENTANYL CITRATE 0.05 MG/ML IJ SOLN
INTRAMUSCULAR | Status: DC | PRN
  Administered 2011-09-23: 50 ug via INTRAVENOUS

## 2011-09-23 MED ORDER — SODIUM BICARBONATE 4 % IV SOLN
INTRAVENOUS | Status: DC | PRN
  Administered 2011-09-23: 5 mL via INTRAVENOUS

## 2011-09-23 MED ORDER — LIDOCAINE HCL 1 % IJ SOLN
INTRAMUSCULAR | Status: DC | PRN
  Administered 2011-09-23: 4 mL

## 2011-09-23 MED ORDER — MORPHINE SULFATE 10 MG/ML IJ SOLN: 2.0000 mg | INTRAMUSCULAR | Status: DC | PRN

## 2011-09-23 MED ORDER — HYDROMORPHONE HCL PF 1 MG/ML IJ SOLN: 0.2500 mg | INTRAMUSCULAR | Status: DC | PRN

## 2011-09-23 MED ORDER — BUPIVACAINE HCL 0.5 % IJ SOLN
INTRAMUSCULAR | Status: DC | PRN
  Administered 2011-09-23: 30 mL

## 2011-09-23 MED ORDER — LACTATED RINGERS IV SOLN
INTRAVENOUS | Status: DC | PRN
  Administered 2011-09-23: 09:00:00 via INTRAVENOUS

## 2011-09-23 MED ORDER — PROPOFOL 10 MG/ML IV EMUL
INTRAVENOUS | Status: DC | PRN
  Administered 2011-09-23 (×7): 10 mg via INTRAVENOUS

## 2011-09-23 SURGICAL SUPPLY — 33 items
BANDAGE GAUZE ELAST BULKY 4 IN (GAUZE/BANDAGES/DRESSINGS) ×2 IMPLANT
BLADE SURG 15 STRL LF DISP TIS (BLADE) ×1 IMPLANT
BLADE SURG 15 STRL SS (BLADE) ×2
CANISTER SUCTION 2500CC (MISCELLANEOUS) ×2 IMPLANT
CLOTH BEACON ORANGE TIMEOUT ST (SAFETY) ×2 IMPLANT
COVER SURGICAL LIGHT HANDLE (MISCELLANEOUS) ×2 IMPLANT
DECANTER SPIKE VIAL GLASS SM (MISCELLANEOUS) IMPLANT
DRAPE LAPAROSCOPIC ABDOMINAL (DRAPES) IMPLANT
DRSG PAD ABDOMINAL 8X10 ST (GAUZE/BANDAGES/DRESSINGS) ×2 IMPLANT
ELECT CAUTERY BLADE 6.4 (BLADE) ×2 IMPLANT
ELECT REM PT RETURN 9FT ADLT (ELECTROSURGICAL) ×2
ELECTRODE REM PT RTRN 9FT ADLT (ELECTROSURGICAL) ×1 IMPLANT
GLOVE BIO SURGEON STRL SZ7.5 (GLOVE) ×2 IMPLANT
GOWN STRL NON-REIN LRG LVL3 (GOWN DISPOSABLE) ×4 IMPLANT
HOVERMATT SINGLE USE (MISCELLANEOUS) ×2 IMPLANT
KIT BASIN OR (CUSTOM PROCEDURE TRAY) ×2 IMPLANT
NEEDLE HYPO 25X1 1.5 SAFETY (NEEDLE) IMPLANT
NS IRRIG 1000ML POUR BTL (IV SOLUTION) ×2 IMPLANT
PENCIL BUTTON HOLSTER BLD 10FT (ELECTRODE) ×2 IMPLANT
SPONGE GAUZE 4X4 12PLY (GAUZE/BANDAGES/DRESSINGS) IMPLANT
SPONGE LAP 18X18 X RAY DECT (DISPOSABLE) ×2 IMPLANT
SUT MNCRL AB 4-0 PS2 18 (SUTURE) IMPLANT
SUT VIC AB 3-0 SH 27 (SUTURE)
SUT VIC AB 3-0 SH 27XBRD (SUTURE) IMPLANT
SUT VIC AB 3-0 SH 8-18 (SUTURE) ×2 IMPLANT
SWAB COLLECTION DEVICE MRSA (MISCELLANEOUS) IMPLANT
SYR BULB 3OZ (MISCELLANEOUS) ×2 IMPLANT
SYR CONTROL 10ML LL (SYRINGE) IMPLANT
TAPE CLOTH SURG 4X10 WHT LF (GAUZE/BANDAGES/DRESSINGS) ×2 IMPLANT
TOWEL OR 17X26 10 PK STRL BLUE (TOWEL DISPOSABLE) ×2 IMPLANT
TUBE ANAEROBIC SPECIMEN COL (MISCELLANEOUS) IMPLANT
WATER STERILE IRR 1000ML POUR (IV SOLUTION) IMPLANT
YANKAUER SUCT BULB TIP NO VENT (SUCTIONS) ×2 IMPLANT

## 2011-09-23 NOTE — Progress Notes (Signed)
CARE MANAGEMENT NOTE 09/23/2011  Patient:  [name], [name]   Account Number:  [id number]  Date Initiated:  09/23/2011  Documentation initiated by:  Maxamillion Banas  Subjective/Objective Assessment:   76 yo male admitted with renal failure, S/P right buttock abscess I&D. PCP Dr.Dean.     Action/Plan:   Home vs Rehab   Anticipated DC Date:  09/27/2011   Anticipated DC Plan:  HOME W HOME HEALTH SERVICES  In-house referral  NA      DC Planning Services  CM consult      PAC Choice  IP REHAB   Choice offered to / List presented to:  C-4 Adult Children   DME arranged  NA      DME agency  NA     HH arranged  NA      HH agency  NA   Status of service:  In process, will continue to follow Medicare Important Message given?   (If response is "NO", the following Medicare IM given date fields will be blank) Date Medicare IM given:   Date Additional Medicare IM given:    Discharge Disposition:    Per UR Regulation:    If discussed at Long Length of Stay Meetings, dates discussed:    Comments:  09/23/11 1347 [name] [phone] Cm spoke with pt with adukt daughter who is an Rn at bedside. CM consulted concerning medication assistance, per daughter pt unable to afford current medication regimen. Pt has Medicare A&B, and AARP. Pt states med copays too expensive. Pt currently taking 20 home meds, only 4 on generic list. CM provided pt's daughter with [facility] generic drug list, [email or web address] applications for seveal brand name meds. pt and daughter encouraged to ask MD to switch meds to generic. Pt and daughter advised to have MD assist with needymeds applications prior to dc. Pt eligible for indigent funds. Pt eligible for outpt heart failure medication assistance. Pt daughter request HHRN upon d/c. Pt and daughter given choice list for [facility].

## 2011-09-23 NOTE — Anesthesia Postprocedure Evaluation (Signed)
  Anesthesia Post-op Note  Patient: [name]  Procedure(s) Performed: Procedure(s) (LRB): INCISION AND DRAINAGE ABSCESS (Right)  Patient Location: PACU  Anesthesia Type: General  Level of Consciousness: awake and alert   Airway and Oxygen Therapy: Patient Spontanous Breathing  Post-op Pain: mild  Post-op Assessment: Post-op Vital signs reviewed, Patient's Cardiovascular Status Stable, Respiratory Function Stable, Patent Airway and No signs of Nausea or vomiting  Post-op Vital Signs: stable  Complications: No apparent anesthesia complications

## 2011-09-23 NOTE — Anesthesia Preprocedure Evaluation (Addendum)
Anesthesia Evaluation    Airway Mallampati: II TM Distance: >3 FB Neck ROM: Full    Dental No notable dental hx.    Pulmonary COPD COPD inhaler,  breath sounds clear to auscultation  Pulmonary exam normal       Cardiovascular hypertension, Pt. on home beta blockers +CHF Rhythm:Regular Rate:Normal  Echo 2004 reviewed: EF 40-50%, mild-mod TR. Per patient he is 2.5 years s/p 5 vessel cabg ([place])   Neuro/Psych PSYCHIATRIC DISORDERS Depression    GI/Hepatic   Endo/Other  Diabetes mellitus-, Type 2, Insulin DependentMorbid obesity  Renal/GU      Musculoskeletal   Abdominal (+) + obese,   Peds  Hematology   Anesthesia Other Findings   Reproductive/Obstetrics                          Anesthesia Physical Anesthesia Plan  ASA: III  Anesthesia Plan: MAC   Post-op Pain Management:    Induction:   Airway Management Planned: Simple Face Mask  Additional Equipment:   Intra-op Plan:   Post-operative Plan:   Informed Consent:   Plan Discussed with:   Anesthesia Plan Comments: (Will attempt MAC as this would probably be safer than GETA. Will convert to GETA if necessary.)       Anesthesia Quick Evaluation

## 2011-09-23 NOTE — Transfer of Care (Signed)
Immediate Anesthesia Transfer of Care Note  Patient: [name]  Procedure(s) Performed: Procedure(s) (LRB): INCISION AND DRAINAGE ABSCESS (Right)  Patient Location: PACU  Anesthesia Type: MAC  Level of Consciousness: awake and alert   Airway & Oxygen Therapy: Patient Spontanous Breathing and Patient connected to face mask oxygen  Post-op Assessment: Report given to PACU RN and Post -op Vital signs reviewed and stable  Post vital signs: Reviewed and stable  Complications: No apparent anesthesia complications

## 2011-09-23 NOTE — Op Note (Signed)
Operative Note  [name] male 76 y.o. 09/23/2011  PREOPERATIVE DX:  Complex right buttock abscess  POSTOPERATIVE DX:  Same  PROCEDURE: Extensive incision, drainage, and debridement of complex right buttock abscess         Surgeon: Adolph Pollack   Assistants: None  Anesthesia: Monitored Local Anesthesia with Sedation  Indications:  This is a 76 year old male with a right buttock abscess draining from multiple areas. It is incompletely drained. He now presents for the above procedure.    Procedure Detail:  He was brought to the operating room and placed in the left lateral decubitus position. He was given intravenous sedation and the right buttock was sterilely prepped and draped. Local anesthetic (mixture of Xylocaine and Marcaine) was infiltrated superficially and deep all around the right buttock abscess. I sharply excised a full thickness skin and subcutaneous tissue elliptical plug. Multiple pockets of purulent material were drained and some of this fluid was sent for culture. I then used sharp dissection to debride surrounding indurated tissue down to normal tissue. The debridement included skin and subcutaneous tissue. This was all sent to pathology.  No further abscess cavities were noted at this time. Bleeding was controlled electrocautery and interrupted 3-0 Vicryl sutures. Once hemostasis was adequate the wound was packed tightly with saline moistened gauze followed by a bulky dry dressing  He tolerated the procedure well without any apparent complications and was taken to the recovery room in satisfactory condition.    Estimated Blood Loss:  200 mL         Drains: none          Blood Given: none          Specimens: Abscess fluid for culture. Abscess tissue to pathology.        Complications:  * No complications entered in OR log *         Disposition: PACU - hemodynamically stable.         Condition: stable

## 2011-09-23 NOTE — Progress Notes (Signed)
Subjective:  Had recent surgery for right buttock abscess. Afebrile.  Objective:  Vital Signs in the last 24 hours: Temp:  [97.4 F (36.3 C)-98.5 F (36.9 C)] 97.4 F (36.3 C) (04/13 1114) Pulse Rate:  [67-74] 68  (04/13 0500) Cardiac Rhythm:  [-] Normal sinus rhythm (04/13 1114) Resp:  [15-20] 15  (04/13 1114) BP: (141-159)/(69-84) 155/70 mmHg (04/13 1114) SpO2:  [98 %-100 %] 98 % (04/13 1114) Weight:  [151.2 kg (333 lb 5.4 oz)] 151.2 kg (333 lb 5.4 oz) (04/13 0500)  Physical Exam: BP Readings from Last 1 Encounters:  09/23/11 155/70    Wt Readings from Last 1 Encounters:  09/23/11 151.2 kg (333 lb 5.4 oz)    Weight change: 0.4 kg (14.1 oz)  HEENT: [place]/AT, Eyes-Blue, PERL, EOMI, Conjunctiva-Pale pink, Sclera-Non-icteric Neck: No JVD, No bruit, Trachea midline. Lungs:  Clear, Bilateral. Cardiac:  Regular rhythm, normal S1 and S2, no S3.  Abdomen:  Soft, non-tender. Extremities:  No edema present. No cyanosis. No clubbing. Bulky dressing over right buttock. CNS: AxOx3, Cranial nerves grossly intact,  Right handed. Skin: Warm and dry.   Intake/Output from previous day: 04/12 0701 - 04/13 0700 In: 2040 [P.O.:240; I.V.:1800] Out: 600 [Urine:600]    Lab Results: BMET    Component Value Date/Time   NA 133* 09/22/2011 0640   K 3.6 09/22/2011 0640   CL 103 09/22/2011 0640   CO2 18* 09/22/2011 0640   GLUCOSE 219* 09/22/2011 0640   BUN 39* 09/22/2011 0640   CREATININE 3.70* 09/22/2011 0640   CALCIUM 8.7 09/22/2011 0640   GFRNONAA 15* 09/22/2011 0640   GFRAA 17* 09/22/2011 0640   CBC    Component Value Date/Time   WBC 11.8* 09/22/2011 0640   RBC 3.27* 09/22/2011 0640   HGB 9.8* 09/22/2011 0640   HCT 27.7* 09/22/2011 0640   PLT 210 09/22/2011 0640   MCV 84.7 09/22/2011 0640   MCH 30.0 09/22/2011 0640   MCHC 35.4 09/22/2011 0640   RDW 13.2 09/22/2011 0640   CARDIAC ENZYMES Lab Results  Component Value Date   CKTOTAL 64 09/22/2011   CKMB 2.3 09/22/2011   TROPONINI <0.30 09/22/2011      Assessment/Plan:  Patient Active Hospital Problem List:  Respiratory insufficiency, multifactorial.  Patient with COPD history, pulmonary hypertension.  Acute renal insufficiency. No evidence for obstruction by renal ultrasound. Proteinuria noted.  Infected right buttock ulcer. S/P I&D.  Uncontrolled diabetes mellitus insulin requiring. Patient with elevated hemoglobin A1c as well. .  Coronary artery disease history.  S/P CABG Major depression.   Obesity.  History degenerative joint disease. Status post bilateral knee replacements.  Rule out urinary tract infection. Culture pending.     LOS: 2 days    Orpah Cobb  MD  09/23/2011, 1:10 PM

## 2011-09-24 LAB — GLUCOSE, CAPILLARY
Glucose-Capillary: 156 mg/dL — ABNORMAL HIGH (ref 70–99)
Glucose-Capillary: 174 mg/dL — ABNORMAL HIGH (ref 70–99)
Glucose-Capillary: 180 mg/dL — ABNORMAL HIGH (ref 70–99)
Glucose-Capillary: 187 mg/dL — ABNORMAL HIGH (ref 70–99)

## 2011-09-24 NOTE — Progress Notes (Signed)
1 Day Post-Op  Subjective: Feels much better since I&D. Much less pain  Objective: Vital signs in last 24 hours: Temp:  [97.4 F (36.3 C)-98 F (36.7 C)] 98 F (36.7 C) (04/14 0640) Pulse Rate:  [60] 60  (04/13 1300) Resp:  [15-20] 20  (04/14 0640) BP: (144-155)/(70-72) 144/71 mmHg (04/14 0640) SpO2:  [98 %-100 %] 100 % (04/14 0757) Weight:  [336 lb 6.4 oz (152.59 kg)] 336 lb 6.4 oz (152.59 kg) (04/14 0615)   Intake/Output from previous day: 04/13 0701 - 04/14 0700 In: 2535 [P.O.:960; I.V.:1075; IV Piggyback:500] Out: 1220 [Urine:1220] Intake/Output this shift:     General appearance: alert, cooperative and no distress  Incision: Dressing without significant blood. Will need repcking BID  Lab Results:   [facility] 09/22/11 0640 09/21/11 1743  WBC 11.8* 9.5  HGB 9.8* 9.9*  HCT 27.7* 28.2*  PLT 210 217   BMET  Basename 09/22/11 0640 09/21/11 1743  NA 133* 127*  K 3.6 4.0  CL 103 97  CO2 18* 16*  GLUCOSE 219* 487*  BUN 39* 39*  CREATININE 3.70* 3.78*  CALCIUM 8.7 8.0*   PT/INR No results found for this basename: LABPROT:2,INR:2 in the last 72 hours ABG No results found for this basename: PHART:2,PCO2:2,PO2:2,HCO3:2 in the last 72 hours  MEDS, Scheduled    . aspirin EC  81 mg Oral Daily  . budesonide-formoterol  2 puff Inhalation BID  . carvedilol  25 mg Oral BID WC  . insulin aspart  0-15 Units Subcutaneous Q4H  . insulin glargine  20 Units Subcutaneous BID  . levothyroxine  50 mcg Oral QAC breakfast  . linagliptin  5 mg Oral Daily  . simvastatin  10 mg Oral q1800  . sodium chloride  3 mL Intravenous Q12H  . Tamsulosin HCl  0.4 mg Oral Daily  . vancomycin  2,000 mg Intravenous Q48H    Studies/Results: US Renal  09/22/2011  *RADIOLOGY REPORT*  Clinical Data: Acute renal failure.  RENAL/URINARY TRACT ULTRASOUND COMPLETE  Comparison:  None  Findings:  Right Kidney:  12.7 cm.  No hydronephrosis.  Normal echotexture. Suspect slight cortical thinning.   Visualization is difficult due to body habitus.  No visible focal abnormality.  Left Kidney:  12.2 cm.  Normal echotexture.  No hydronephrosis. Suspect mild cortical thinning.  Again, visualization is difficult due to body habitus.  The  Bladder:  Grossly unremarkable.  IMPRESSION: Limited visualization due to body habitus.  No hydronephrosis. Suspect mild cortical thinning.  Original Report Authenticated By: Cyndie Chime, M.D.    Assessment: s/p Procedure(s): INCISION AND DRAINAGE ABSCESS Stable post op  Plan: Start BID dressing changes   LOS: 3 days     Currie Paris, MD, [facility], [place] [phone]   09/24/2011 8:47 AM

## 2011-09-24 NOTE — Progress Notes (Addendum)
Subjective:  Feeling better.  Objective:  Vital Signs in the last 24 hours: Temp:  [98 F (36.7 C)-98.5 F (36.9 C)] 98.5 F (36.9 C) (04/14 1300) Pulse Rate:  [64] 64  (04/14 1300) Cardiac Rhythm:  [-] Normal sinus rhythm (04/14 0830) Resp:  [20] 20  (04/14 1300) BP: (131-144)/(7-71) 131/7 mmHg (04/14 1300) SpO2:  [98 %-100 %] 100 % (04/14 0757) Weight:  [152.59 kg (336 lb 6.4 oz)] 152.59 kg (336 lb 6.4 oz) (04/14 0615)  Physical Exam: BP Readings from Last 1 Encounters:  09/24/11 131/7    Wt Readings from Last 1 Encounters:  09/24/11 152.59 kg (336 lb 6.4 oz)    Weight change: 1.39 kg (3 lb 1 oz)  HEENT: [place]/AT, Eyes-Brown, PERL, EOMI, Conjunctiva-Pale pink, Sclera-Non-icteric Neck: No JVD, No bruit, Trachea midline. Lungs:  Clear, Bilateral. Cardiac:  Regular rhythm, normal S1 and S2, no S3.  Abdomen:  Soft, non-tender. Extremities:  No edema present. No cyanosis. No clubbing. Right buttock covered with dressing. CNS: AxOx3, Cranial nerves grossly intact, moves all 4 extremities. Right handed. Skin: Warm and dry.   Intake/Output from previous day: 04/13 0701 - 04/14 0700 In: 2535 [P.O.:960; I.V.:1075; IV Piggyback:500] Out: 1220 [Urine:1220]    Lab Results: BMET    Component Value Date/Time   NA 133* 09/22/2011 0640   K 3.6 09/22/2011 0640   CL 103 09/22/2011 0640   CO2 18* 09/22/2011 0640   GLUCOSE 219* 09/22/2011 0640   BUN 39* 09/22/2011 0640   CREATININE 3.70* 09/22/2011 0640   CALCIUM 8.7 09/22/2011 0640   GFRNONAA 15* 09/22/2011 0640   GFRAA 17* 09/22/2011 0640   CBC    Component Value Date/Time   WBC 11.8* 09/22/2011 0640   RBC 3.27* 09/22/2011 0640   HGB 9.8* 09/22/2011 0640   HCT 27.7* 09/22/2011 0640   PLT 210 09/22/2011 0640   MCV 84.7 09/22/2011 0640   MCH 30.0 09/22/2011 0640   MCHC 35.4 09/22/2011 0640   RDW 13.2 09/22/2011 0640   CARDIAC ENZYMES Lab Results  Component Value Date   CKTOTAL 64 09/22/2011   CKMB 2.3 09/22/2011   TROPONINI <0.30  09/22/2011    Assessment/Plan:  Patient Active Hospital Problem List:  Respiratory insufficiency, multifactorial.  Patient with COPD history, pulmonary hypertension.  Acute renal insufficiency. No evidence for obstruction by renal ultrasound. Proteinuria noted.  Infected right buttock ulcer. S/P I&D.  Uncontrolled diabetes mellitus insulin requiring. Patient with elevated hemoglobin A1c as well. Coronary artery disease history.  S/P CABG  Major depression.  Obesity.  History degenerative joint disease. Status post bilateral knee replacements.  Rule out urinary tract infection. Culture pending.     LOS: 3 days    Orpah Cobb  MD  09/24/2011, 3:51 PM     After review of the clinical data patient has chronic respiratory failure secondary to obesity, COPD, Pulmonary HTN.  Orpah Cobb MD 09/25/2011, 5: 37 PM

## 2011-09-25 LAB — GLUCOSE, CAPILLARY
Glucose-Capillary: 161 mg/dL — ABNORMAL HIGH (ref 70–99)
Glucose-Capillary: 181 mg/dL — ABNORMAL HIGH (ref 70–99)
Glucose-Capillary: 220 mg/dL — ABNORMAL HIGH (ref 70–99)

## 2011-09-25 LAB — CREATININE, SERUM: GFR calc Af Amer: 18 mL/min — ABNORMAL LOW (ref >90)

## 2011-09-25 NOTE — Progress Notes (Signed)
ANTIBIOTIC CONSULT NOTE - FOLLOW UP  Pharmacy Consult for Vancomycin Indication: right buttock abscess  Allergies  Allergen Reactions  . Penicillins     Pt denies  Allergy to me this may be an error  . Sulfa Antibiotics     Information from Childhood.  He didn't know reaction    Vital Signs: Temp: 97.7 F (36.5 C) (04/15 0450) Temp src: Oral (04/15 0450) BP: 140/73 mmHg (04/15 0450) Pulse Rate: 65  (04/15 1008) Intake/Output from previous day: 04/14 0701 - 04/15 0700 In: 1080 [P.O.:480; I.V.:600] Out: 1250 [Urine:1250] Intake/Output from this shift: Total I/O In: 360 [P.O.:360] Out: 150 [Urine:150]  Labs:  Basename 09/25/11 0430  WBC --  HGB --  PLT --  LABCREA --  CREATININE 3.55*   Estimated Creatinine Clearance: 27.5 ml/min (by C-G formula based on Cr of 3.55). No results found for this basename: VANCOTROUGH:2,VANCOPEAK:2,VANCORANDOM:2,GENTTROUGH:2,GENTPEAK:2,GENTRANDOM:2,TOBRATROUGH:2,TOBRAPEAK:2,TOBRARND:2,AMIKACINPEAK:2,AMIKACINTROU:2,AMIKACIN:2, in the last 72 hours   Microbiology: Recent Results (from the past 720 hour(s))  URINE CULTURE     Status: Normal   Collection Time   09/21/11 10:20 PM      Component Value Range Status Comment   Specimen Description URINE, RANDOM   Final    Special Requests NONE   Final    Culture  Setup Time [id number]   Final    Colony Count >=100,000 COLONIES/ML   Final    Culture     Final    Value: GROUP B STREP(S.AGALACTIAE)ISOLATED     Note: TESTING AGAINST S. AGALACTIAE NOT ROUTINELY PERFORMED DUE TO PREDICTABILITY OF AMP/PEN/VAN SUSCEPTIBILITY.   Report Status 09/23/2011 FINAL   Final   CULTURE, ROUTINE-ABSCESS     Status: Normal (Preliminary result)   Collection Time   09/23/11 10:03 AM      Component Value Range Status Comment   Specimen Description BUTTOCKS RIGHT   Final    Special Requests NONE   Final    Gram Stain     Final    Value: NO WBC SEEN     RARE SQUAMOUS EPITHELIAL CELLS PRESENT     FEW GRAM  POSITIVE COCCI IN PAIRS   Culture     Final    Value: ABUNDANT STAPHYLOCOCCUS AUREUS     Note: RIFAMPIN AND GENTAMICIN SHOULD NOT BE USED AS SINGLE DRUGS FOR TREATMENT OF STAPH INFECTIONS.   Report Status PENDING   Incomplete   ANAEROBIC CULTURE     Status: Normal (Preliminary result)   Collection Time   09/23/11 10:04 AM      Component Value Range Status Comment   Specimen Description BUTTOCKS RIGHT   Final    Special Requests NONE   Final    Gram Stain     Final    Value: NO WBC SEEN     RARE SQUAMOUS EPITHELIAL CELLS PRESENT     FEW GRAM POSITIVE COCCI IN PAIRS   Culture     Final    Value: NO ANAEROBES ISOLATED; CULTURE IN PROGRESS FOR 5 DAYS   Report Status PENDING   Incomplete     Anti-infectives     Start     Dose/Rate Route Frequency Ordered Stop   09/24/11 0000   vancomycin (VANCOCIN) 2,000 mg in sodium chloride 0.9 % 500 mL IVPB        2,000 mg 250 mL/hr over 120 Minutes Intravenous Every 48 hours 09/22/11 0039     09/22/11 0115   vancomycin (VANCOCIN) 2,500 mg in sodium chloride 0.9 % 500 mL IVPB  2,500 mg 250 mL/hr over 120 Minutes Intravenous  Once 09/22/11 0039 09/22/11 0338          Assessment: 76 yo F on day #4 vancomycin for R buttock wound.  Culture of buttock abscess (4/13) is growing abundant staph aureus.  Patient is obese and in ARI (SCr 3.55, baseline 1.5-2), CrCl (N)~ 18 ml/min so current dosing is 2g IV q48h. Will order a level before tomorrow's dose to see where we're at.  Goal of Therapy:  Vancomycin trough level 15-20 mcg/ml  Plan:  Continue vancomycin 2g IV q48 and check level before dose tonight.  F/u SCr and culture results.  Clance Boll 09/25/2011,10:58 AM

## 2011-09-25 NOTE — Progress Notes (Signed)
Patient ID: [name], male   DOB: 24-Oct-1935, 76 y.o.   MRN: [id number]  General Surgery - [facility] - Progress Note  POD# 2  Subjective: Patient up in chair.  Comfortable.  No complaints.  Dressing already changed this AM.  Objective: Vital signs in last 24 hours: Temp:  [97.6 F (36.4 C)-98.5 F (36.9 C)] 97.7 F (36.5 C) (04/15 0450) Pulse Rate:  [64-66] 66  (04/15 0450) Resp:  [20] 20  (04/15 0450) BP: (131-145)/(7-82) 140/73 mmHg (04/15 0450) SpO2:  [99 %-100 %] 100 % (04/15 0450) Weight:  [337 lb (152.862 kg)] 337 lb (152.862 kg) (04/15 0359) Last BM Date: 09/23/11  Intake/Output from previous day: 04/14 0701 - 04/15 0700 In: 1080 [P.O.:480; I.V.:600] Out: 1250 [Urine:1250]  Exam: HEENT - clear, not icteric Buttock - dressing dry and intact  Lab Results:  No results found for this basename: WBC:2,HGB:2,HCT:2,PLT:2 in the last 72 hours   Basename 09/25/11 0430  NA --  K --  CL --  CO2 --  GLUCOSE --  BUN --  CREATININE 3.55*  CALCIUM --    Studies/Results: No results found.  Assessment / Plan: 1.  Status post incision and drainage of buttock abscess  - culture shows staph aureus  - twice daily dressing changes ongoing  - will follow up later this week  - per medical service  Velora Heckler, MD, [facility] Office: [phone]  09/25/2011

## 2011-09-25 NOTE — Clinical Documentation Improvement (Signed)
RESPIRATORY FAILURE DOCUMENTATION CLARIFICATION QUERY   THIS DOCUMENT IS NOT A PERMANENT PART OF THE MEDICAL RECORD  TO RESPOND TO THE THIS QUERY, FOLLOW THE INSTRUCTIONS BELOW:  1. If needed, update documentation for the patient's encounter via the notes activity.  2. Access this query again and click edit on the In [employer].  3. After updating, or not, click F2 to complete all highlighted (required) fields concerning your review. Select "additional documentation in the medical record" OR "no additional documentation provided".  4. Click Sign note button.  5. The deficiency will fall out of your In Basket *Please let us know if you are not able to complete this workflow by phone or e-mail (listed below).  Please update your documentation within the medical record to reflect your response to this query.                                                                                    09/25/11  Dear Dr. Ricki Rodriguez, [facility],  In a better effort to capture your patient's severity of illness, reflect appropriate length of stay and utilization of resources, a review of the patient medical record has revealed the following indicators.    Based on your clinical judgment, please clarify and document in a progress note and/or discharge summary the clinical condition associated with the following supporting information:  In responding to this query please exercise your independent judgment.  The fact that a query is asked, does not imply that any particular answer is desired or expected.  Pt. Admitted with COPD.  According to pn pt with Resp insufficiency.   Please clarify if resp insufficiency can be further specified as one of the diagnosis listed below and document in pn or d/c summary.     Possible Clinical Conditions?  _______Acute Respiratory Failure _______Acute on Chronic Respiratory Failure _Yes___Chronic Respiratory Failure   _______Other  Condition________________ _______Cannot Clinically Determine    Supporting Information:  PN 09/22/11 Respiratory insufficiency, multifactorial. Patient with COPD history, pulmonary hypertension. Acute renal insufficiency. No evidence for obstruction by renal ultrasound. Proteinuria noted. Infected right buttock ulcer. This may need I&D. Uncontrolled diabetes mellitus insulin requiring. Patient with elevated hemoglobin A1c as well. COPD with pulmonary hypertension. Pro BNP is 2500.  Risk Factors: COPD, Resp. Insufficiency,  Signs&Symptoms:    Lab: Component     Latest Ref Rng 09/22/2011  WBC     4.0 - 10.5 K/uL 11.8 (H)    Treatment: PROVENTIL  Oxygen: Simple mask @ 10 liters continous O2 with O2 sat of 92%       You may use possible, probable, or suspect with inpatient documentation. possible, probable, suspected diagnoses MUST be documented at the time of discharge  Reviewed: additional documentation in the medical record  Thank You,  Enis Slipper  RN, BSN, CCDS Clinical Documentation Specialist [facility] Dept Pager: [phone] / E-mail: [email or web address]  Health Information Management [place]

## 2011-09-25 NOTE — Progress Notes (Signed)
Subjective:  History as noted. Patient feeling much better today. He did not denies any buttock discomfort. He notes his breathing is slowly improve. He still becomes markedly dyspneic with mild exertion. He denies actual substernal sharp pain. No unusual diaphoresis. His appetite has been good. Blood sugars are gradually decreasing. Patient acknowledges having not been on  his medications for greater than a month.   Allergies  Allergen Reactions  . Penicillins     Pt denies  Allergy to me this may be an error  . Sulfa Antibiotics     Information from Childhood.  He didn't know reaction   Current Facility-Administered Medications  Medication Dose Route Frequency Provider Last Rate Last Dose  . 0.9 %  sodium chloride infusion   Intravenous Continuous Gwenyth Bender, MD 75 mL/hr at 09/25/11 1231 75 mL/hr at 09/25/11 1231  . albuterol (PROVENTIL HFA;VENTOLIN HFA) 108 (90 BASE) MCG/ACT inhaler 2 puff  2 puff Inhalation Q6H PRN Dorothea Ogle, MD      . albuterol (PROVENTIL) (5 MG/ML) 0.5% nebulizer solution 2.5 mg  2.5 mg Nebulization Q2H PRN Dorothea Ogle, MD   2.5 mg at 09/21/11 2238  . aspirin EC tablet 81 mg  81 mg Oral Daily Dorothea Ogle, MD   81 mg at 09/25/11 1610  . budesonide-formoterol (SYMBICORT) 160-4.5 MCG/ACT inhaler 2 puff  2 puff Inhalation BID Dorothea Ogle, MD   2 puff at 09/25/11 1008  . carvedilol (COREG) tablet 25 mg  25 mg Oral BID WC Dorothea Ogle, MD   25 mg at 09/25/11 1736  . HYDROcodone-acetaminophen (NORCO) 5-325 MG per tablet 1-2 tablet  1-2 tablet Oral Q4H PRN Dorothea Ogle, MD      . insulin aspart (novoLOG) injection 0-15 Units  0-15 Units Subcutaneous Q4H Dorothea Ogle, MD   3 Units at 09/25/11 2049  . insulin glargine (LANTUS) injection 20 Units  20 Units Subcutaneous BID Dorothea Ogle, MD   20 Units at 09/25/11 0910  . levothyroxine (SYNTHROID, LEVOTHROID) tablet 50 mcg  50 mcg Oral QAC breakfast Dorothea Ogle, MD   50 mcg at 09/25/11 0909  . linagliptin  (TRADJENTA) tablet 5 mg  5 mg Oral Daily Dorothea Ogle, MD   5 mg at 09/25/11 0908  . morphine injection 2-4 mg  2-4 mg Intravenous Q2H PRN Adolph Pollack, MD      . simvastatin (ZOCOR) tablet 10 mg  10 mg Oral q1800 Dorothea Ogle, MD   10 mg at 09/25/11 1736  . sodium chloride 0.9 % injection 3 mL  3 mL Intravenous Q12H Dorothea Ogle, MD   3 mL at 09/24/11 2138  . Tamsulosin HCl (FLOMAX) capsule 0.4 mg  0.4 mg Oral Daily Dorothea Ogle, MD   0.4 mg at 09/25/11 0908  . vancomycin (VANCOCIN) 2,000 mg in sodium chloride 0.9 % 500 mL IVPB  2,000 mg Intravenous Q48H Lorenza Evangelist, PHARMD   2,000 mg at 09/24/11 0033    Objective: Blood pressure 187/95, pulse 62, temperature 97.6 F (36.4 C), temperature source Oral, resp. rate 16, height 5\' 10"  (1.778 m), weight 337 lb (152.862 kg), SpO2 100.00%.  Well-developed overweight white male in no acute distress. HEENT: No sinus tenderness. No sclera icterus. No posterior cervical nodes. NECK: No enlarged thyroid. LUNGS: Distant breath sounds bilaterally. No wheezes or rales appreciated. No vocal fremitus. Patient slowly moving more air than before. CV: Normal S1, S2 without S3. No rub.  ABD: Obese. Nontender. MSK: Trace edema. Negative Homans. NEURO: Intact.  Lab results: Results for orders placed during the hospital encounter of 09/21/11 (from the past 48 hour(s))  GLUCOSE, CAPILLARY     Status: Abnormal   Collection Time   09/24/11 12:02 AM      Component Value Range Comment   Glucose-Capillary 174 (*) 70 - 99 (mg/dL)   GLUCOSE, CAPILLARY     Status: Abnormal   Collection Time   09/24/11  3:48 AM      Component Value Range Comment   Glucose-Capillary 156 (*) 70 - 99 (mg/dL)   GLUCOSE, CAPILLARY     Status: Abnormal   Collection Time   09/24/11  7:09 AM      Component Value Range Comment   Glucose-Capillary 152 (*) 70 - 99 (mg/dL)   GLUCOSE, CAPILLARY     Status: Abnormal   Collection Time   09/24/11 11:29 AM      Component Value Range  Comment   Glucose-Capillary 190 (*) 70 - 99 (mg/dL)   GLUCOSE, CAPILLARY     Status: Abnormal   Collection Time   09/24/11  4:18 PM      Component Value Range Comment   Glucose-Capillary 187 (*) 70 - 99 (mg/dL)   GLUCOSE, CAPILLARY     Status: Abnormal   Collection Time   09/24/11  8:09 PM      Component Value Range Comment   Glucose-Capillary 180 (*) 70 - 99 (mg/dL)   GLUCOSE, CAPILLARY     Status: Abnormal   Collection Time   09/25/11 12:06 AM      Component Value Range Comment   Glucose-Capillary 161 (*) 70 - 99 (mg/dL)   GLUCOSE, CAPILLARY     Status: Abnormal   Collection Time   09/25/11  3:53 AM      Component Value Range Comment   Glucose-Capillary 143 (*) 70 - 99 (mg/dL)   CREATININE, SERUM     Status: Abnormal   Collection Time   09/25/11  4:30 AM      Component Value Range Comment   Creatinine, Ser 3.55 (*) 0.50 - 1.35 (mg/dL)    GFR calc non Af Amer 16 (*) >90 (mL/min)    GFR calc Af Amer 18 (*) >90 (mL/min)   GLUCOSE, CAPILLARY     Status: Abnormal   Collection Time   09/25/11  7:47 AM      Component Value Range Comment   Glucose-Capillary 167 (*) 70 - 99 (mg/dL)   GLUCOSE, CAPILLARY     Status: Abnormal   Collection Time   09/25/11 11:29 AM      Component Value Range Comment   Glucose-Capillary 149 (*) 70 - 99 (mg/dL)   GLUCOSE, CAPILLARY     Status: Abnormal   Collection Time   09/25/11  5:04 PM      Component Value Range Comment   Glucose-Capillary 179 (*) 70 - 99 (mg/dL)   GLUCOSE, CAPILLARY     Status: Abnormal   Collection Time   09/25/11  8:03 PM      Component Value Range Comment   Glucose-Capillary 181 (*) 70 - 99 (mg/dL)     Studies/Results: No results found.  There is no problem list on file for this patient.   Impression: Status post incision and drainage of buttock abscesses. Staph aureus wound infection. Rule out MRSA. Uncontrolled diabetes mellitus. Complicated by #1. Acute on chronic diastolic congestive heart failure. COPD with history  of asthmatic bronchitis. Major  depressive disorder. Hypertension. Morbid obesity. Degenerative joint disease. Status post the replacements.   Plan: Continue IV antibiotics. Incentive spirometry. Pulmonary consult to maximize his supplement therapy. Followup pro BNP is in a.m. Continue sliding-scale coverage and insulin therapy.    [name], [name] 09/25/2011 10:15 PM

## 2011-09-25 NOTE — Clinical Documentation Improvement (Signed)
EXCISIONAL DEBRIDEMENT DOCUMENTATION CLARIFICATION  THIS DOCUMENT IS NOT A PERMANENT PART OF THE MEDICAL RECORD  TO RESPOND TO THE THIS QUERY, FOLLOW THE INSTRUCTIONS BELOW:  1. If needed, update documentation for the patient's encounter via the notes activity.  2. Access this query again and click edit on the In [employer].  3. After updating, or not, click F2 to complete all highlighted (required) fields concerning your review. Select "additional documentation in the medical record" OR "no additional documentation provided".  4. Click Sign note button.  5. The deficiency will fall out of your In Basket *Please let us know if you are not able to complete this workflow by phone or e-mail (listed below).  Please update your documentation within the medical record to reflect your response to this query.                                                                                        09/25/11   Dear Dr.  Jim Desanctis Rosenbower,/ Associates,  In a better effort to capture your patient's severity of illness, reflect appropriate length of stay and utilization of resources, a review of the patient medical record has revealed the following indicators.    Based on your clinical judgment, please clarify and document in a progress note and/or discharge summary the clinical condition associated with the following supporting information:  In responding to this query please exercise your independent judgment.  The fact that a query is asked, does not imply that any particular answer is desired or expected. Because that is documentation in the medical record of "debridement" clarification is needed.   Pt with right buttock wound per H/P  According to Op note pt with "Extensive Incision, and drainage, and debridement of complex right buttock abscess."   Please clarify the following in the pn or d/c summary  If "Extensive Incision" can be further specified as excisional debridement or another  diagnosis  Please note type of instrument used to excise and debridement  Please document your responses in pn or d/c summary.  Please document the below (4) key elements in a progress note:  1.  Type of Debridement:          Excisional Debridement-  Cutting away necrotic, devitalized tissue or slough to  Level of viable tissue using a sharp instrument (example, scalpel, scissors, etc).           NON Excisional Debridement-  The removal of necrotic, devitalized tissue or slough by means of scraping, mechanical brushing, flushing, or washing. (example: irrigation, whirlpool);  Minor removal of loose fragments  2.  Please indicate instrument used:  Scissors, Scalpel, Curette, or other  3.  Please document depth of the debridement:             Partial Thickness  Full Thickness  Skin and Subcutaneous Tissue  Subcutaneous Tissue and Muscle  Subcutaneous Tissue, Muscle and Bone  Other  4.  Document the size of the debridement.    Risk Factors I & D right buttock wound Signs/Symptoms  Op Note: 09/23/11 Op note states right buttock Abscess  Sharply excised a full thickness skin and subcutaneous  tissue  sharp dissection to debride surrounding indurated tissue down to normal tissue  Debridement included skin and subcutaneous tissue   You may use possible, probable, or suspect with inpatient documentation. possible, probable, suspected diagnoses MUST be documented at the time of discharge  Reviewed:  no additional documentation provided Waiting on RQ from coding.[email or web address]  Thank You,  Enis Slipper  RN, BSN, CCDS Clinical Documentation Specialist [facility] Dept Pager: [phone] / E-mail: [email or web address]  Health Information Management [place]

## 2011-09-26 DIAGNOSIS — R0602 Shortness of breath: Secondary | ICD-10-CM

## 2011-09-26 LAB — GLUCOSE, CAPILLARY
Glucose-Capillary: 152 mg/dL — ABNORMAL HIGH (ref 70–99)
Glucose-Capillary: 138 mg/dL — ABNORMAL HIGH (ref 70–99)

## 2011-09-26 LAB — CULTURE, ROUTINE-ABSCESS

## 2011-09-26 LAB — VANCOMYCIN, TROUGH: Vancomycin Tr: 18.9 ug/mL (ref 10.0–20.0)

## 2011-09-26 LAB — COMPREHENSIVE METABOLIC PANEL
ALT: 7 U/L (ref 0–53)
AST: <5 U/L (ref 0–37)
Alkaline Phosphatase: 110 U/L (ref 39–117)
CO2: 17 mEq/L — ABNORMAL LOW (ref 19–32)
Chloride: 110 mEq/L (ref 96–112)
GFR calc Af Amer: 19 mL/min — ABNORMAL LOW (ref >90)
GFR calc non Af Amer: 17 mL/min — ABNORMAL LOW (ref >90)
Glucose, Bld: 130 mg/dL — ABNORMAL HIGH (ref 70–99)
Potassium: 4.3 mEq/L (ref 3.5–5.1)
Sodium: 135 mEq/L (ref 135–145)
Total Bilirubin: 0.2 mg/dL — ABNORMAL LOW (ref 0.3–1.2)

## 2011-09-26 MED ORDER — FUROSEMIDE 10 MG/ML IJ SOLN
40.0000 mg | Freq: Two times a day (BID) | INTRAMUSCULAR | Status: DC
  Administered 2011-09-26 – 2011-09-28 (×5): 40 mg via INTRAVENOUS
  Filled 2011-09-26 (×8): qty 4

## 2011-09-26 MED ORDER — POTASSIUM CHLORIDE 20 MEQ/15ML (10%) PO LIQD
40.0000 meq | Freq: Two times a day (BID) | ORAL | Status: DC
  Administered 2011-09-26 – 2011-09-27 (×3): 40 meq via ORAL
  Filled 2011-09-26 (×5): qty 30

## 2011-09-26 NOTE — Progress Notes (Signed)
3 Days Post-Op  Subjective: Doing well  Objective: Vital signs in last 24 hours: Temp:  [97.5 F (36.4 C)-98.7 F (37.1 C)] 97.5 F (36.4 C) (04/16 0416) Pulse Rate:  [45-62] 60  (04/16 0416) Resp:  [16-20] 20  (04/16 0416) BP: (137-187)/(75-95) 144/75 mmHg (04/16 0416) SpO2:  [100 %] 100 % (04/16 0821) FiO2 (%):  [21 %] 21 % (04/16 0821) Weight:  [154.404 kg (340 lb 6.4 oz)] 154.404 kg (340 lb 6.4 oz) (04/16 0416) Last BM Date: 09/25/11  Intake/Output from previous day: 04/15 0701 - 04/16 0700 In: 3057.8 [P.O.:1062; I.V.:1495.8; IV Piggyback:500] Out: 1885 [Urine:1885] Intake/Output this shift: Total I/O In: 640 [P.O.:640] Out: 1200 [Urine:1200]  Incision/Wound:Looks good, clean, with wet to dry dressing.  Lab Results:  No results found for this basename: WBC:2,HGB:2,HCT:2,PLT:2 in the last 72 hours  BMET  [facility] 09/26/11 0623 09/25/11 0430  NA 135 --  K 4.3 --  CL 110 --  CO2 17* --  GLUCOSE 130* --  BUN 48* --  CREATININE 3.38* 3.55*  CALCIUM 8.2* --   PT/INR No results found for this basename: LABPROT:2,INR:2 in the last 72 hours   Lab 09/26/11 0623 09/21/11 2205  AST <5 <5  ALT 7 8  ALKPHOS 110 156*  BILITOT 0.2* 0.4  PROT 6.3 8.1  ALBUMIN 2.3* 3.0*     Lipase  No results found for this basename: lipase     Studies/Results: No results found.  Medications:    . aspirin EC  81 mg Oral Daily  . budesonide-formoterol  2 puff Inhalation BID  . carvedilol  25 mg Oral BID WC  . furosemide  40 mg Intravenous Q12H  . insulin aspart  0-15 Units Subcutaneous Q4H  . insulin glargine  20 Units Subcutaneous BID  . levothyroxine  50 mcg Oral QAC breakfast  . linagliptin  5 mg Oral Daily  . simvastatin  10 mg Oral q1800  . sodium chloride  3 mL Intravenous Q12H  . Tamsulosin HCl  0.4 mg Oral Daily  . vancomycin  2,000 mg Intravenous Q48H    Assessment/Plan Status post incision and drainage of buttock abscess It looks very good .  Will need wet  to dry dressing till it heals. - culture shows staph aureus  - twice daily dressing changes ongoing  - will follow up later this week  - per medical service         LOS: 5 days    Ayleen Mckinstry 09/26/2011

## 2011-09-26 NOTE — Progress Notes (Signed)
ANTIBIOTIC CONSULT NOTE - FOLLOW UP  Pharmacy Consult for Vancomycin Indication: right buttock abscess  Allergies  Allergen Reactions  . Penicillins     Pt denies  Allergy to me this may be an error  . Sulfa Antibiotics     Information from Childhood.  He didn't know reaction    Vital Signs: Temp: 97.6 F (36.4 C) (04/15 2212) Temp src: Oral (04/15 2212) BP: 161/81 mmHg (04/15 2248) Pulse Rate: 61  (04/15 2248) Intake/Output from previous day: 04/15 0701 - 04/16 0700 In: 2462 [P.O.:1062; I.V.:900; IV Piggyback:500] Out: 1735 [Urine:1735] Intake/Output from this shift: Total I/O In: 722 [P.O.:222; IV Piggyback:500] Out: 760 [Urine:760]  Labs:  Basename 09/25/11 0430  WBC --  HGB --  PLT --  LABCREA --  CREATININE 3.55*   Estimated Creatinine Clearance: 27.5 ml/min (by C-G formula based on Cr of 3.55).  Basename 09/26/11 0005  VANCOTROUGH 18.9  VANCOPEAK --  VANCORANDOM --  GENTTROUGH --  GENTPEAK --  GENTRANDOM --  TOBRATROUGH --  TOBRAPEAK --  TOBRARND --  AMIKACINPEAK --  AMIKACINTROU --  AMIKACIN --     Microbiology: Recent Results (from the past 720 hour(s))  URINE CULTURE     Status: Normal   Collection Time   09/21/11 10:20 PM      Component Value Range Status Comment   Specimen Description URINE, RANDOM   Final    Special Requests NONE   Final    Culture  Setup Time [id number]   Final    Colony Count >=100,000 COLONIES/ML   Final    Culture     Final    Value: GROUP B STREP(S.AGALACTIAE)ISOLATED     Note: TESTING AGAINST S. AGALACTIAE NOT ROUTINELY PERFORMED DUE TO PREDICTABILITY OF AMP/PEN/VAN SUSCEPTIBILITY.   Report Status 09/23/2011 FINAL   Final   CULTURE, ROUTINE-ABSCESS     Status: Normal (Preliminary result)   Collection Time   09/23/11 10:03 AM      Component Value Range Status Comment   Specimen Description BUTTOCKS RIGHT   Final    Special Requests NONE   Final    Gram Stain     Final    Value: NO WBC SEEN     RARE SQUAMOUS  EPITHELIAL CELLS PRESENT     FEW GRAM POSITIVE COCCI IN PAIRS   Culture     Final    Value: ABUNDANT STAPHYLOCOCCUS AUREUS     Note: RIFAMPIN AND GENTAMICIN SHOULD NOT BE USED AS SINGLE DRUGS FOR TREATMENT OF STAPH INFECTIONS.   Report Status PENDING   Incomplete   ANAEROBIC CULTURE     Status: Normal (Preliminary result)   Collection Time   09/23/11 10:04 AM      Component Value Range Status Comment   Specimen Description BUTTOCKS RIGHT   Final    Special Requests NONE   Final    Gram Stain     Final    Value: NO WBC SEEN     RARE SQUAMOUS EPITHELIAL CELLS PRESENT     FEW GRAM POSITIVE COCCI IN PAIRS   Culture     Final    Value: NO ANAEROBES ISOLATED; CULTURE IN PROGRESS FOR 5 DAYS   Report Status PENDING   Incomplete     Anti-infectives     Start     Dose/Rate Route Frequency Ordered Stop   09/24/11 0000   vancomycin (VANCOCIN) 2,000 mg in sodium chloride 0.9 % 500 mL IVPB        2,000  mg 250 mL/hr over 120 Minutes Intravenous Every 48 hours 09/22/11 0039     09/22/11 0115   vancomycin (VANCOCIN) 2,500 mg in sodium chloride 0.9 % 500 mL IVPB        2,500 mg 250 mL/hr over 120 Minutes Intravenous  Once 09/22/11 0039 09/22/11 0338          Assessment: 76 yo F on day #4 vancomycin for R buttock wound.  Culture of buttock abscess (4/13) is growing abundant staph aureus.  Patient is obese and in ARI (SCr 3.55, baseline 1.5-2), CrCl (N)~ 18 ml/min so current dosing is 2g IV q48h. VT = 18.9 mg/L- @ goal.  Goal of Therapy:  Vancomycin trough level 15-20 mcg/ml  Plan:   Continue vancomycin 2g IV q48h.   F/u SCr and culture results.  Susanne Greenhouse R 09/26/2011,1:28 AM

## 2011-09-26 NOTE — Consult Note (Signed)
Referring physician:  Chief Complaint  Patient presents with  . Shortness of Breath  . Pressure Ulcer    HISTORY of PRESENT ILLNESS:  [name] is a 76 y.o. male never smoker admitted on 09/21/2011 with dyspnea.  This is likely from obstructive asthma, systolic heart failure, and obesity with deconditioning.  He has a long history of asthma.  He also used to have trouble with seasonal allergies.  He has been using inhaler therapy for years.  He recently became more depressed, and stopped using his medicines including his inhalers.  He did notice his breathing got some worse.  He has since been restarted on inhalers.  He is not very active.  He will walk about 20 feet, and then get short of breath.  He recovers quickly after he sits down.  He has tried using his albuterol when this happens, and this maybe helps some.  He occasional gets a cough.  He is not sure if he gets wheeze, and does not usually have chest congestion or tightness.  He does not have sputum production, and denies hemoptysis.  He used to have trouble with allergies, but not over the past few years.  He is not aware of adverse reaction to aspirin.  He denies skin rashes.  He has been getting some leg swelling.  He denies any history of thrombo-embolic disease.  He had pneumonia several years ago.  There is no history of tuberculosis.  He is currently disabled due to heart problems and depression.  He used to work in [employer].  He does not recall seeing a lung doctor before, and has not had PFT's recently.  He does have sleep apnea, and uses CPAP with oxygen at night.  He is not aware of needing to use oxygen during the day.   Cultures: Urine 4/11>> Buttock abscess 4/13>>Staphylococcus Aureus  Antibiotics: Vancomycin 4/11>>  Tests/events: 4/11 Renal u/s>>no hydronephrosis, mild cortical thinning 4/12 Echo>>EF 45 to 50%, mild LVH, PAS 33 mmHg 4/13 Rt buttocks abscess I&D   Past Medical History  Diagnosis Date  .  Diabetes mellitus   . CHF (congestive heart failure)   . Chronic airway obstruction, not elsewhere classified   . Osteoarthrosis, unspecified whether generalized or localized, unspecified site   . Chronic kidney disease, stage III (moderate)   . Obesity, unspecified   . Osteoarthrosis, unspecified whether generalized or localized, unspecified site   . Osteoporosis   . Depression   . Irregular heart beat     Past Surgical History  Procedure Date  . Knee surgery   . Back surgery   . Cardiac surgery     5 bipasses, carotid artery    History reviewed. No pertinent family history.   reports that he has never smoked. He has never used smokeless tobacco. He reports that he drinks alcohol. He reports that he does not use illicit drugs.  Allergies  Allergen Reactions  . Penicillins     Pt denies  Allergy to me this may be an error  . Sulfa Antibiotics     Information from Childhood.  He didn't know reaction   ROS: Negative except above.  PHYICAL EXAM:  Blood pressure 144/75, pulse 60, temperature 97.5 F (36.4 C), temperature source Oral, resp. rate 20, height 5\' 10"  (1.778 m), weight 340 lb 6.4 oz (154.404 kg), SpO2 100.00%.  Body mass index is 48.84 kg/(m^2).  I/O last 3 completed shifts: In: 3657.8 [P.O.:1062; I.V.:2095.8; IV Piggyback:500] Out: 2635 [Urine:2635]   Physical Exam:  General - obese, speaks in full sentences HEENT - wears glasses, PERRLA, EOMI, no sinus tenderness, no oral exudate, no LAN Cardiac - s1s2 regular, no murmur Chest - good air entry, no wheeze or rales Abd - obese, soft, nontender, normal bowel sounds Ext- ankle edema, no cyanosis or clubbing Neuro - normal strength, cranial nerves intact Psych - normal mood, behavior   Lab Results  Component Value Date   WBC 11.8* 09/22/2011   HGB 9.8* 09/22/2011   HCT 27.7* 09/22/2011   MCV 84.7 09/22/2011   PLT 210 09/22/2011  ,  Lab Results  Component Value Date   CREATININE 3.38* 09/26/2011   BUN  48* 09/26/2011   NA 135 09/26/2011   K 4.3 09/26/2011   CL 110 09/26/2011   CO2 17* 09/26/2011  ,  Lab Results  Component Value Date   ALT 7 09/26/2011   AST <5 09/26/2011   ALKPHOS 110 09/26/2011   BILITOT 0.2* 09/26/2011  ,  Lab Results  Component Value Date   CKTOTAL 64 09/22/2011   CKMB 2.3 09/22/2011   TROPONINI <0.30 09/22/2011    Lab Results  Component Value Date   ESRSEDRATE 104* 09/26/2011  ,  Lab Results  Component Value Date   TSH 1.562 09/21/2011    RADIOLOGY REPORT 09/21/11 Clinical Data: Shortness of breath.  CHEST - 2 VIEW  Comparison: Chest x-ray 03/15/2007.  Findings: Lung volumes are normal. No consolidative airspace disease. No definite pleural effusions (lateral projection is limited by lack of visualization of the inferior costophrenic sulci). Pulmonary vasculature is normal. Mild cardiomegaly is unchanged. Mediastinal contours are unremarkable. The patient is status post median sternotomyfor CABG. Atherosclerotic calcifications are noted within the arch of the aorta. A small stent is in place, likely within the left anterior descending coronary artery.  IMPRESSION:  1. No definite radiographic evidence of acute cardiopulmonary disease.  2. Mild cardiomegaly is unchanged.  3. Postoperative and post procedural changes, as above.  Original Report Authenticated By: Florencia Reasons, M.D.   ASSESSMENT/PLAN:   76 yo male with multifactorial dyspnea related to asthma, systolic heart failure, and obesity with deconditioning.  Asthma with reported history of COPD.  No hx of smoking -he reports benefit from inhaler therapy>>will continue symbicort and prn albuterol -will arrange for PFT's to further assess  OSA -he is to continue with CPAP with oxygen at night  CAD, systolic CHF, PVD -being managed by primary care and cardiology  Obesity with deconditioning -would likely benefit from PT evaluation, and then start on graded exercise program when more  stable  DM -per primary care  Right buttocks abscess with Staph aureus -wound care per surgery -Abx per primary care and surgery  Chronic kidney disease -f/u renal fx and monitor urine outpt  Anemia of chronic disease -f/u CBC -transfuse for Hb < 7    Satia Winger 09/26/2011, 2:06 PM Pager:  [phone]

## 2011-09-26 NOTE — Progress Notes (Signed)
   CARE MANAGEMENT NOTE 09/26/2011  Patient:  [name], [name]   Account Number:  [id number]  Date Initiated:  09/23/2011  Documentation initiated by:  DAVIS,TYMEEKA  Subjective/Objective Assessment:   76 yo male admitted with renal failure, S/P right buttock abscess I&D. PCP Dr.Dean.     Action/Plan:   Home vs Rehab   Anticipated DC Date:  09/28/2011   Anticipated DC Plan:  HOME W HOME HEALTH SERVICES  In-house referral  NA      DC Planning Services  CM consult      PAC Choice  IP REHAB   Choice offered to / List presented to:  C-4 Adult Children   DME arranged  NA      DME agency  NA     HH arranged  NA      HH agency  NA   Status of service:  In process, will continue to follow Medicare Important Message given?   (If response is "NO", the following Medicare IM given date fields will be blank) Date Medicare IM given:   Date Additional Medicare IM given:    Discharge Disposition:    Per UR Regulation:    If discussed at Long Length of Stay Meetings, dates discussed:    Comments:  09/26/11 Tad Fancher RN,BSN NCM [phone] PATIENT ASKING ABOUT INPT REHAB.DR. August Saucer AWARE,& PLANS TO ORDER PT EVAL.[facility] WAS USED IN THE PAST, & [facility] DME FOR HOME 02.PATIENT HAS ALL DME EXCEPT A 3N1.  09/23/11 1347 [name] [phone] Cm spoke with pt with adukt daughter who is an Rn at bedside. CM consulted concerning medication assistance, per daughter pt unable to afford current medication regimen. Pt has Medicare A&B, and AARP. Pt states med copays too expensive. Pt currently taking 20 home meds, only 4 on generic list. CM provided pt's daughter with [facility] generic drug list, needymed.com applications for seveal brand name meds. pt and daughter encouraged to ask MD to switch meds to generic. Pt and daughter advised to have MD assist with needymeds applications prior to dc. Pt eligible for indigent funds. Pt eligible for outpt heart failure medication assistance. Pt daughter  request HHRN upon d/c. Pt and daughter given choice list for [facility].

## 2011-09-26 NOTE — Progress Notes (Signed)
General Surgery [facility] - Attending  Continue wound care.  Will follow.  Velora Heckler, MD, [facility] Office: [phone]

## 2011-09-26 NOTE — Progress Notes (Signed)
Subjective:  Patient feeling better today than yesterday. He is presently diuresing a great deal after Lasix 40 mg IV was given. He denies any leg cramps. His been no chest pains. He has less resting dyspnea. Patient was seen by pulmonary medicine a day as well. Evaluation and testing in progress. He has had followup by surgery for his buttock wounds. He continues on IV antibiotics.   Allergies  Allergen Reactions  . Penicillins     Pt denies  Allergy to me this may be an error  . Sulfa Antibiotics     Information from Childhood.  He didn't know reaction   Current Facility-Administered Medications  Medication Dose Route Frequency Provider Last Rate Last Dose  . 0.9 %  sodium chloride infusion   Intravenous Continuous Gwenyth Bender, MD 20 mL/hr at 09/26/11 1339 20 mL/hr at 09/26/11 1339  . albuterol (PROVENTIL HFA;VENTOLIN HFA) 108 (90 BASE) MCG/ACT inhaler 2 puff  2 puff Inhalation Q6H PRN Dorothea Ogle, MD      . albuterol (PROVENTIL) (5 MG/ML) 0.5% nebulizer solution 2.5 mg  2.5 mg Nebulization Q2H PRN Dorothea Ogle, MD   2.5 mg at 09/21/11 2238  . aspirin EC tablet 81 mg  81 mg Oral Daily Dorothea Ogle, MD   81 mg at 09/26/11 0920  . budesonide-formoterol (SYMBICORT) 160-4.5 MCG/ACT inhaler 2 puff  2 puff Inhalation BID Dorothea Ogle, MD   2 puff at 09/26/11 2242  . carvedilol (COREG) tablet 25 mg  25 mg Oral BID WC Dorothea Ogle, MD   25 mg at 09/26/11 1655  . furosemide (LASIX) injection 40 mg  40 mg Intravenous Q12H Gwenyth Bender, MD   40 mg at 09/26/11 2126  . HYDROcodone-acetaminophen (NORCO) 5-325 MG per tablet 1-2 tablet  1-2 tablet Oral Q4H PRN Dorothea Ogle, MD   2 tablet at 09/26/11 1208  . insulin aspart (novoLOG) injection 0-15 Units  0-15 Units Subcutaneous Q4H Dorothea Ogle, MD   2 Units at 09/26/11 2127  . insulin glargine (LANTUS) injection 20 Units  20 Units Subcutaneous BID Dorothea Ogle, MD   20 Units at 09/26/11 2127  . levothyroxine (SYNTHROID, LEVOTHROID) tablet 50 mcg   50 mcg Oral QAC breakfast Dorothea Ogle, MD   50 mcg at 09/26/11 0850  . linagliptin (TRADJENTA) tablet 5 mg  5 mg Oral Daily Dorothea Ogle, MD   5 mg at 09/26/11 0920  . morphine injection 2-4 mg  2-4 mg Intravenous Q2H PRN Adolph Pollack, MD      . potassium chloride 20 MEQ/15ML (10%) liquid 40 mEq  40 mEq Oral BID Gwenyth Bender, MD      . simvastatin (ZOCOR) tablet 10 mg  10 mg Oral [id number] Dorothea Ogle, MD   10 mg at 09/26/11 1702  . sodium chloride 0.9 % injection 3 mL  3 mL Intravenous Q12H Dorothea Ogle, MD   3 mL at 09/26/11 2128  . Tamsulosin HCl (FLOMAX) capsule 0.4 mg  0.4 mg Oral Daily Dorothea Ogle, MD   0.4 mg at 09/26/11 0920  . vancomycin (VANCOCIN) 2,000 mg in sodium chloride 0.9 % 500 mL IVPB  2,000 mg Intravenous Q48H Lorenza Evangelist, PHARMD   2,000 mg at 09/26/11 0023    Objective: Blood pressure 157/90, pulse 54, temperature 97.4 F (36.3 C), temperature source Oral, resp. rate 16, height 5\' 10"  (1.778 m), weight 340 lb 6.4 oz (154.404 kg), SpO2  100.00%.  Well-developed obese white male presently in no acute distress. HEENT: No sinus tenderness. NECK: No enlarged thyroid. No posterior cervical nodes. LUNGS: Clear to auscultation. No wheezes or rales appreciated. No vocal fremitus. CV: Normal S1, S2. 1/6 systolic ejection murmur second left ICS. No rub. ABD: Soft nontender. MSK: Negative Homans. No edema. NEURO: Intact.  Lab results: Results for orders placed during the hospital encounter of 09/21/11 (from the past 48 hour(s))  GLUCOSE, CAPILLARY     Status: Abnormal   Collection Time   09/25/11 12:06 AM      Component Value Range Comment   Glucose-Capillary 161 (*) 70 - 99 (mg/dL)   GLUCOSE, CAPILLARY     Status: Abnormal   Collection Time   09/25/11  3:53 AM      Component Value Range Comment   Glucose-Capillary 143 (*) 70 - 99 (mg/dL)   CREATININE, SERUM     Status: Abnormal   Collection Time   09/25/11  4:30 AM      Component Value Range Comment    Creatinine, Ser 3.55 (*) 0.50 - 1.35 (mg/dL)    GFR calc non Af Amer 16 (*) >90 (mL/min)    GFR calc Af Amer 18 (*) >90 (mL/min)   GLUCOSE, CAPILLARY     Status: Abnormal   Collection Time   09/25/11  7:47 AM      Component Value Range Comment   Glucose-Capillary 167 (*) 70 - 99 (mg/dL)   GLUCOSE, CAPILLARY     Status: Abnormal   Collection Time   09/25/11 11:29 AM      Component Value Range Comment   Glucose-Capillary 149 (*) 70 - 99 (mg/dL)   GLUCOSE, CAPILLARY     Status: Abnormal   Collection Time   09/25/11  5:04 PM      Component Value Range Comment   Glucose-Capillary 179 (*) 70 - 99 (mg/dL)   GLUCOSE, CAPILLARY     Status: Abnormal   Collection Time   09/25/11  8:03 PM      Component Value Range Comment   Glucose-Capillary 181 (*) 70 - 99 (mg/dL)   GLUCOSE, CAPILLARY     Status: Abnormal   Collection Time   09/25/11 11:38 PM      Component Value Range Comment   Glucose-Capillary 220 (*) 70 - 99 (mg/dL)   VANCOMYCIN, TROUGH     Status: Normal   Collection Time   09/26/11 12:05 AM      Component Value Range Comment   Vancomycin Tr 18.9  10.0 - 20.0 (ug/mL)   GLUCOSE, CAPILLARY     Status: Abnormal   Collection Time   09/26/11  4:22 AM      Component Value Range Comment   Glucose-Capillary 175 (*) 70 - 99 (mg/dL)   COMPREHENSIVE METABOLIC PANEL     Status: Abnormal   Collection Time   09/26/11  6:23 AM      Component Value Range Comment   Sodium 135  135 - 145 (mEq/L)    Potassium 4.3  3.5 - 5.1 (mEq/L)    Chloride 110  96 - 112 (mEq/L)    CO2 17 (*) 19 - 32 (mEq/L)    Glucose, Bld 130 (*) 70 - 99 (mg/dL)    BUN 48 (*) 6 - 23 (mg/dL)    Creatinine, Ser 5.78 (*) 0.50 - 1.35 (mg/dL)    Calcium 8.2 (*) 8.4 - 10.5 (mg/dL)    Total Protein 6.3  6.0 - 8.3 (g/dL)  Albumin 2.3 (*) 3.5 - 5.2 (g/dL)    AST <5  0 - 37 (U/L) REPEATED TO VERIFY   ALT 7  0 - 53 (U/L)    Alkaline Phosphatase 110  39 - 117 (U/L)    Total Bilirubin 0.2 (*) 0.3 - 1.2 (mg/dL)    GFR calc non Af  Amer 17 (*) >90 (mL/min)    GFR calc Af Amer 19 (*) >90 (mL/min)   PRO B NATRIURETIC PEPTIDE     Status: Abnormal   Collection Time   09/26/11  6:23 AM      Component Value Range Comment   Pro B Natriuretic peptide (BNP) 5642.0 (*) 0 - 125 (pg/mL)   SEDIMENTATION RATE     Status: Abnormal   Collection Time   09/26/11  6:23 AM      Component Value Range Comment   Sed Rate 104 (*) 0 - 16 (mm/hr)   GLUCOSE, CAPILLARY     Status: Abnormal   Collection Time   09/26/11  7:33 AM      Component Value Range Comment   Glucose-Capillary 111 (*) 70 - 99 (mg/dL)    Comment 1 Notify RN     GLUCOSE, CAPILLARY     Status: Abnormal   Collection Time   09/26/11 11:59 AM      Component Value Range Comment   Glucose-Capillary 152 (*) 70 - 99 (mg/dL)   GLUCOSE, CAPILLARY     Status: Abnormal   Collection Time   09/26/11  4:42 PM      Component Value Range Comment   Glucose-Capillary 143 (*) 70 - 99 (mg/dL)   GLUCOSE, CAPILLARY     Status: Abnormal   Collection Time   09/26/11  8:10 PM      Component Value Range Comment   Glucose-Capillary 138 (*) 70 - 99 (mg/dL)     Studies/Results: No results found.  There is no problem list on file for this patient.   Impression: Buttock abscesses secondary to staph infection. Status post I&D. Congestive heart failure. Rule out right-sided heart failure. COPD and asthma. Diabetes mellitus. Blood sugars improving. Relatively acute renal failure. No evidence for obstruction. Deconditioning. Multifactorial. History of major depression. Mood is improved with being back on his medication. Deconditioning. Multifactorial. Inflammatory colopathy. Rule out other. Patient with large protein albumin split.   Plan: IV Lasix a day for diuresis. Supplement with extra potassium. Followup [facility], proBNP in a.m. Continue IV antibiotics. Surgical followup appreciated. PT consult. Pulmonary medicine followup. Check SPEP, myeloma panel.   August Saucer, Alexis Mizuno 09/26/2011 10:48  PM

## 2011-09-27 ENCOUNTER — Inpatient Hospital Stay (HOSPITAL_COMMUNITY): Payer: Medicare Other

## 2011-09-27 ENCOUNTER — Encounter (HOSPITAL_COMMUNITY): Payer: Self-pay | Admitting: Pulmonary Disease

## 2011-09-27 DIAGNOSIS — E119 Type 2 diabetes mellitus without complications: Secondary | ICD-10-CM

## 2011-09-27 DIAGNOSIS — J449 Chronic obstructive pulmonary disease, unspecified: Secondary | ICD-10-CM | POA: Diagnosis present

## 2011-09-27 DIAGNOSIS — N179 Acute kidney failure, unspecified: Secondary | ICD-10-CM | POA: Diagnosis present

## 2011-09-27 DIAGNOSIS — IMO0002 Reserved for concepts with insufficient information to code with codable children: Secondary | ICD-10-CM | POA: Diagnosis present

## 2011-09-27 DIAGNOSIS — I509 Heart failure, unspecified: Secondary | ICD-10-CM | POA: Diagnosis present

## 2011-09-27 LAB — COMPREHENSIVE METABOLIC PANEL WITH GFR
ALT: 9 U/L (ref 0–53)
AST: 5 U/L (ref 0–37)
Albumin: 2.7 g/dL — ABNORMAL LOW (ref 3.5–5.2)
Alkaline Phosphatase: 126 U/L — ABNORMAL HIGH (ref 39–117)
BUN: 49 mg/dL — ABNORMAL HIGH (ref 6–23)
CO2: 18 meq/L — ABNORMAL LOW (ref 19–32)
Calcium: 8.6 mg/dL (ref 8.4–10.5)
Chloride: 108 meq/L (ref 96–112)
Creatinine, Ser: 3.4 mg/dL — ABNORMAL HIGH (ref 0.50–1.35)
GFR calc Af Amer: 19 mL/min — ABNORMAL LOW
GFR calc non Af Amer: 17 mL/min — ABNORMAL LOW
Glucose, Bld: 116 mg/dL — ABNORMAL HIGH (ref 70–99)
Potassium: 4.9 meq/L (ref 3.5–5.1)
Sodium: 137 meq/L (ref 135–145)
Total Bilirubin: 0.2 mg/dL — ABNORMAL LOW (ref 0.3–1.2)
Total Protein: 7.2 g/dL (ref 6.0–8.3)

## 2011-09-27 LAB — GLUCOSE, CAPILLARY
Glucose-Capillary: 150 mg/dL — ABNORMAL HIGH (ref 70–99)
Glucose-Capillary: 93 mg/dL (ref 70–99)
Glucose-Capillary: 137 mg/dL — ABNORMAL HIGH (ref 70–99)
Glucose-Capillary: 141 mg/dL — ABNORMAL HIGH (ref 70–99)
Glucose-Capillary: 147 mg/dL — ABNORMAL HIGH (ref 70–99)

## 2011-09-27 LAB — DIFFERENTIAL
Basophils Absolute: 0 10*3/uL (ref 0.0–0.1)
Basophils Relative: 0 % (ref 0–1)
Eosinophils Absolute: 0.4 10*3/uL (ref 0.0–0.7)
Eosinophils Relative: 5 % (ref 0–5)
Lymphocytes Relative: 15 % (ref 12–46)
Lymphs Abs: 1.3 10*3/uL (ref 0.7–4.0)
Monocytes Absolute: 0.6 10*3/uL (ref 0.1–1.0)
Monocytes Relative: 7 % (ref 3–12)
Neutro Abs: 6.2 10*3/uL (ref 1.7–7.7)
Neutrophils Relative %: 73 % (ref 43–77)

## 2011-09-27 LAB — CBC
Hemoglobin: 9.1 g/dL — ABNORMAL LOW (ref 13.0–17.0)
MCHC: 34.1 g/dL (ref 30.0–36.0)
RDW: 13.4 % (ref 11.5–15.5)

## 2011-09-27 LAB — PRO B NATRIURETIC PEPTIDE: Pro B Natriuretic peptide (BNP): 5409 pg/mL — ABNORMAL HIGH (ref 0–125)

## 2011-09-27 MED ORDER — ALBUTEROL SULFATE (5 MG/ML) 0.5% IN NEBU
2.5000 mg | INHALATION_SOLUTION | Freq: Once | RESPIRATORY_TRACT | Status: AC
  Administered 2011-09-27: 2.5 mg via RESPIRATORY_TRACT

## 2011-09-27 NOTE — Progress Notes (Signed)
[name] is a 76 y.o. male never smoker admitted on 09/21/2011 with dyspnea.  This is likely from obstructive asthma, systolic heart failure, and obesity with deconditioning.  Cultures:  Urine 4/11>>  Buttock abscess 4/13>>Staphylococcus Aureus   Antibiotics:  Vancomycin 4/11>>   Tests/events:  4/11 Renal u/s>>no hydronephrosis, mild cortical thinning  4/12 Echo>>EF 45 to 50%, mild LVH, PAS 33 mmHg  4/13 Rt buttocks abscess I&D 4/17 PFT>>FEV1 2.36(86%), FEV1% 86, TLC 6.43 (91%), DLCO 44%/ corrected for lung volumes 79%, borderline bronchodilator response.  SUBJECTIVE: Breathing okay.  Not having cough, wheeze, or chest tightness.  OBJECTIVE:  Blood pressure 149/68, pulse 56, temperature 97.4 F (36.3 C), temperature source Oral, resp. rate 16, height 5\' 10"  (1.778 m), weight 337 lb 1.6 oz (152.908 kg), SpO2 100.00%. Wt Readings from Last 3 Encounters:  09/27/11 337 lb 1.6 oz (152.908 kg)  09/27/11 337 lb 1.6 oz (152.908 kg)  09/08/10 340 lb (154.223 kg)   Body mass index is 48.37 kg/(m^2).  I/O last 3 completed shifts: In: 3425.5 [P.O.:1662; I.V.:1259.5; IV Piggyback:504] Out: 5465 [Urine:5465]  Physical Exam: General - obese, speaks in full sentences  HEENT - wears glasses, PERRLA, EOMI, no sinus tenderness, no oral exudate, no LAN  Cardiac - s1s2 regular, no murmur  Chest - good air entry, no wheeze or rales  Abd - obese, soft, nontender, normal bowel sounds  Ext- ankle edema, no cyanosis or clubbing  Neuro - normal strength, cranial nerves intact  Psych - normal mood, behavior  CBC    Component Value Date/Time   WBC 8.5 09/27/2011 0440   RBC 3.06* 09/27/2011 0440   HGB 9.1* 09/27/2011 0440   HCT 26.7* 09/27/2011 0440   PLT 217 09/27/2011 0440   MCV 87.3 09/27/2011 0440   MCH 29.7 09/27/2011 0440   MCHC 34.1 09/27/2011 0440   RDW 13.4 09/27/2011 0440   LYMPHSABS 1.3 09/27/2011 0440   MONOABS 0.6 09/27/2011 0440   EOSABS 0.4 09/27/2011 0440   BASOSABS 0.0  09/27/2011 0440    BMET    Component Value Date/Time   NA 137 09/27/2011 0440   K 4.9 09/27/2011 0440   CL 108 09/27/2011 0440   CO2 18* 09/27/2011 0440   GLUCOSE 116* 09/27/2011 0440   BUN 49* 09/27/2011 0440   CREATININE 3.40* 09/27/2011 0440   CALCIUM 8.6 09/27/2011 0440   GFRNONAA 17* 09/27/2011 0440   GFRAA 19* 09/27/2011 0440     ASSESSMENT/PLAN:  76 yo male with multifactorial dyspnea related to asthma, systolic heart failure, and obesity with deconditioning.   Asthma. No hx of smoking, and no evidence for airflow obstruction on PFT. -Continue symbicort and prn albuterol   OSA  -he is to continue with CPAP with oxygen at night   CAD, systolic CHF, PVD  -being managed by primary care and cardiology   Obesity with deconditioning  -would likely benefit from PT evaluation, and then start on graded exercise program when more stable   DM  -per primary care   Right buttocks abscess with Staph aureus  -wound care per surgery  -Abx per primary care and surgery   Chronic kidney disease  -f/u renal fx and monitor urine outpt   Anemia of chronic disease  -f/u CBC  -transfuse for Hb < 7  No additional pulmonary testing needed at this time.  PCCM will sign off.  Please call if additional help needed.  Kely Dohn Pager:  [phone] 09/27/2011, 1:27 PM

## 2011-09-27 NOTE — Progress Notes (Signed)
Subjective:  Patient resting comfortably today. He denies chest pain or resting dyspnea. He was seen by physical therapy and ambulated. He still has exertional dyspnea. There's been no chest pain or diaphoresis. Presently he is collecting his urine for 24 hour urine collection for proteinuria. No other new complaints.   Allergies  Allergen Reactions  . Penicillins     Pt denies  Allergy to me this may be an error  . Sulfa Antibiotics     Information from Childhood.  He didn't know reaction   Current Facility-Administered Medications  Medication Dose Route Frequency Provider Last Rate Last Dose  . 0.9 %  sodium chloride infusion   Intravenous Continuous Gwenyth Bender, MD 20 mL/hr at 09/26/11 1339 20 mL/hr at 09/26/11 1339  . albuterol (PROVENTIL HFA;VENTOLIN HFA) 108 (90 BASE) MCG/ACT inhaler 2 puff  2 puff Inhalation Q6H PRN Dorothea Ogle, MD      . albuterol (PROVENTIL) (5 MG/ML) 0.5% nebulizer solution 2.5 mg  2.5 mg Nebulization Q2H PRN Dorothea Ogle, MD   2.5 mg at 09/21/11 2238  . albuterol (PROVENTIL) (5 MG/ML) 0.5% nebulizer solution 2.5 mg  2.5 mg Nebulization Once Coralyn Helling, MD   2.5 mg at 09/27/11 1048  . aspirin EC tablet 81 mg  81 mg Oral Daily Dorothea Ogle, MD   81 mg at 09/27/11 1236  . budesonide-formoterol (SYMBICORT) 160-4.5 MCG/ACT inhaler 2 puff  2 puff Inhalation BID Dorothea Ogle, MD   2 puff at 09/27/11 0815  . carvedilol (COREG) tablet 25 mg  25 mg Oral BID WC Dorothea Ogle, MD   25 mg at 09/27/11 1718  . furosemide (LASIX) injection 40 mg  40 mg Intravenous Q12H Gwenyth Bender, MD   40 mg at 09/27/11 1717  . HYDROcodone-acetaminophen (NORCO) 5-325 MG per tablet 1-2 tablet  1-2 tablet Oral Q4H PRN Dorothea Ogle, MD   2 tablet at 09/27/11 1718  . insulin aspart (novoLOG) injection 0-15 Units  0-15 Units Subcutaneous Q4H Dorothea Ogle, MD   2 Units at 09/27/11 1717  . insulin glargine (LANTUS) injection 20 Units  20 Units Subcutaneous BID Dorothea Ogle, MD   20 Units at  09/27/11 1237  . levothyroxine (SYNTHROID, LEVOTHROID) tablet 50 mcg  50 mcg Oral QAC breakfast Dorothea Ogle, MD   50 mcg at 09/27/11 0807  . linagliptin (TRADJENTA) tablet 5 mg  5 mg Oral Daily Dorothea Ogle, MD   5 mg at 09/27/11 1237  . morphine injection 2-4 mg  2-4 mg Intravenous Q2H PRN Adolph Pollack, MD      . potassium chloride 20 MEQ/15ML (10%) liquid 40 mEq  40 mEq Oral BID Gwenyth Bender, MD   40 mEq at 09/27/11 1237  . simvastatin (ZOCOR) tablet 10 mg  10 mg Oral q1800 Dorothea Ogle, MD   10 mg at 09/27/11 1718  . sodium chloride 0.9 % injection 3 mL  3 mL Intravenous Q12H Dorothea Ogle, MD   3 mL at 09/26/11 2128  . Tamsulosin HCl (FLOMAX) capsule 0.4 mg  0.4 mg Oral Daily Dorothea Ogle, MD   0.4 mg at 09/27/11 1236  . vancomycin (VANCOCIN) 2,000 mg in sodium chloride 0.9 % 500 mL IVPB  2,000 mg Intravenous Q48H Lorenza Evangelist, PHARMD   2,000 mg at 09/26/11 0023    Objective: Blood pressure 158/75, pulse 62, temperature 97.5 F (36.4 C), temperature source Axillary, resp. rate 18, height  5\' 10"  (1.778 m), weight 337 lb 1.6 oz (152.908 kg), SpO2 100.00%.  Well-developed obese white male presently in no acute distress. HEENT: No sinus tenderness. NECK: No posterior cervical nodes. LUNGS: Clear to auscultation. Distant breath sounds. No wheezes. No vocal fremitus. No CVA tenderness. CV: Normal S1, S2 without S3. ABD: Mild distention. Nontender. MSK: Trace pitting edema. Negative Homans. NEURO: Intact.  Lab results: Results for orders placed during the hospital encounter of 09/21/11 (from the past 48 hour(s))  GLUCOSE, CAPILLARY     Status: Abnormal   Collection Time   09/25/11  8:03 PM      Component Value Range Comment   Glucose-Capillary 181 (*) 70 - 99 (mg/dL)   GLUCOSE, CAPILLARY     Status: Abnormal   Collection Time   09/25/11 11:38 PM      Component Value Range Comment   Glucose-Capillary 220 (*) 70 - 99 (mg/dL)   VANCOMYCIN, TROUGH     Status: Normal    Collection Time   09/26/11 12:05 AM      Component Value Range Comment   Vancomycin Tr 18.9  10.0 - 20.0 (ug/mL)   GLUCOSE, CAPILLARY     Status: Abnormal   Collection Time   09/26/11  4:22 AM      Component Value Range Comment   Glucose-Capillary 175 (*) 70 - 99 (mg/dL)   COMPREHENSIVE METABOLIC PANEL     Status: Abnormal   Collection Time   09/26/11  6:23 AM      Component Value Range Comment   Sodium 135  135 - 145 (mEq/L)    Potassium 4.3  3.5 - 5.1 (mEq/L)    Chloride 110  96 - 112 (mEq/L)    CO2 17 (*) 19 - 32 (mEq/L)    Glucose, Bld 130 (*) 70 - 99 (mg/dL)    BUN 48 (*) 6 - 23 (mg/dL)    Creatinine, Ser 1.61 (*) 0.50 - 1.35 (mg/dL)    Calcium 8.2 (*) 8.4 - 10.5 (mg/dL)    Total Protein 6.3  6.0 - 8.3 (g/dL)    Albumin 2.3 (*) 3.5 - 5.2 (g/dL)    AST <5  0 - 37 (U/L) REPEATED TO VERIFY   ALT 7  0 - 53 (U/L)    Alkaline Phosphatase 110  39 - 117 (U/L)    Total Bilirubin 0.2 (*) 0.3 - 1.2 (mg/dL)    GFR calc non Af Amer 17 (*) >90 (mL/min)    GFR calc Af Amer 19 (*) >90 (mL/min)   PRO B NATRIURETIC PEPTIDE     Status: Abnormal   Collection Time   09/26/11  6:23 AM      Component Value Range Comment   Pro B Natriuretic peptide (BNP) 5642.0 (*) 0 - 125 (pg/mL)   SEDIMENTATION RATE     Status: Abnormal   Collection Time   09/26/11  6:23 AM      Component Value Range Comment   Sed Rate 104 (*) 0 - 16 (mm/hr)   GLUCOSE, CAPILLARY     Status: Abnormal   Collection Time   09/26/11  7:33 AM      Component Value Range Comment   Glucose-Capillary 111 (*) 70 - 99 (mg/dL)    Comment 1 Notify RN     GLUCOSE, CAPILLARY     Status: Abnormal   Collection Time   09/26/11 11:59 AM      Component Value Range Comment   Glucose-Capillary 152 (*) 70 - 99 (mg/dL)  GLUCOSE, CAPILLARY     Status: Abnormal   Collection Time   09/26/11  4:42 PM      Component Value Range Comment   Glucose-Capillary 143 (*) 70 - 99 (mg/dL)   GLUCOSE, CAPILLARY     Status: Abnormal   Collection Time    09/26/11  8:10 PM      Component Value Range Comment   Glucose-Capillary 138 (*) 70 - 99 (mg/dL)   GLUCOSE, CAPILLARY     Status: Abnormal   Collection Time   09/26/11 11:40 PM      Component Value Range Comment   Glucose-Capillary 128 (*) 70 - 99 (mg/dL)    Comment 1 Documented in Chart      Comment 2 Notify RN     GLUCOSE, CAPILLARY     Status: Abnormal   Collection Time   09/27/11  3:54 AM      Component Value Range Comment   Glucose-Capillary 150 (*) 70 - 99 (mg/dL)   COMPREHENSIVE METABOLIC PANEL     Status: Abnormal   Collection Time   09/27/11  4:40 AM      Component Value Range Comment   Sodium 137  135 - 145 (mEq/L)    Potassium 4.9  3.5 - 5.1 (mEq/L)    Chloride 108  96 - 112 (mEq/L)    CO2 18 (*) 19 - 32 (mEq/L)    Glucose, Bld 116 (*) 70 - 99 (mg/dL)    BUN 49 (*) 6 - 23 (mg/dL)    Creatinine, Ser 1.30 (*) 0.50 - 1.35 (mg/dL)    Calcium 8.6  8.4 - 10.5 (mg/dL)    Total Protein 7.2  6.0 - 8.3 (g/dL)    Albumin 2.7 (*) 3.5 - 5.2 (g/dL)    AST 5  0 - 37 (U/L)    ALT 9  0 - 53 (U/L)    Alkaline Phosphatase 126 (*) 39 - 117 (U/L)    Total Bilirubin 0.2 (*) 0.3 - 1.2 (mg/dL)    GFR calc non Af Amer 17 (*) >90 (mL/min)    GFR calc Af Amer 19 (*) >90 (mL/min)   PRO B NATRIURETIC PEPTIDE     Status: Abnormal   Collection Time   09/27/11  4:40 AM      Component Value Range Comment   Pro B Natriuretic peptide (BNP) 5409.0 (*) 0 - 125 (pg/mL)   CBC     Status: Abnormal   Collection Time   09/27/11  4:40 AM      Component Value Range Comment   WBC 8.5  4.0 - 10.5 (K/uL)    RBC 3.06 (*) 4.22 - 5.81 (MIL/uL)    Hemoglobin 9.1 (*) 13.0 - 17.0 (g/dL)    HCT 86.5 (*) 78.4 - 52.0 (%)    MCV 87.3  78.0 - 100.0 (fL)    MCH 29.7  26.0 - 34.0 (pg)    MCHC 34.1  30.0 - 36.0 (g/dL)    RDW 69.6  29.5 - 28.4 (%)    Platelets 217  150 - 400 (K/uL)   DIFFERENTIAL     Status: Normal   Collection Time   09/27/11  4:40 AM      Component Value Range Comment   Neutrophils Relative 73  43  - 77 (%)    Neutro Abs 6.2  1.7 - 7.7 (K/uL)    Lymphocytes Relative 15  12 - 46 (%)    Lymphs Abs 1.3  0.7 - 4.0 (K/uL)  Monocytes Relative 7  3 - 12 (%)    Monocytes Absolute 0.6  0.1 - 1.0 (K/uL)    Eosinophils Relative 5  0 - 5 (%)    Eosinophils Absolute 0.4  0.0 - 0.7 (K/uL)    Basophils Relative 0  0 - 1 (%)    Basophils Absolute 0.0  0.0 - 0.1 (K/uL)   GLUCOSE, CAPILLARY     Status: Normal   Collection Time   09/27/11  7:44 AM      Component Value Range Comment   Glucose-Capillary 93  70 - 99 (mg/dL)   GLUCOSE, CAPILLARY     Status: Abnormal   Collection Time   09/27/11 12:14 PM      Component Value Range Comment   Glucose-Capillary 137 (*) 70 - 99 (mg/dL)   GLUCOSE, CAPILLARY     Status: Abnormal   Collection Time   09/27/11  4:31 PM      Component Value Range Comment   Glucose-Capillary 141 (*) 70 - 99 (mg/dL)     Studies/Results: No results found.  Patient Active Problem List  Diagnoses  . Diabetes mellitus  . COPD with respiratory distress, acute  . CHF (congestive heart failure), NYHA class III  . Abscess of cellulitis of buttock  . Renal failure (ARF), acute on chronic    Impression: Abscess and cellulitis of the buttock. Status post I&D per surgery. Followup continues. Staph aureus infection. Patient on IV vancomycin. Exertional dyspnea multifactorial. COPD with history of congestive heart failure. Patient pro BNP today is 5409. Coronary artery disease history status post CABG. Diabetes mellitus previously poorly controlled secondary to dietary noncompliance. Acute on chronic renal insufficiency. On review of office records he has had fluctuating creatinines as high as 3.8 last year. Proteinuria. Rule out nephrotic range. Questionable inflammatory gammopathy. Rule out multiple myeloma versus other. History of major depression. Degenerative joint disease. Rule out other.    Plan: Continue IV antibiotics. Surgical followup wound care. Continue IV  Lasix as tolerated. Followup his pro BNP in [facility]. Renal team consult to rule out any correctable causes of his renal failure. Complete 24 hour urine collection to exclude nephrotic range proteinuria. PT consult placed. Baseline hemoglobin A1c, and uric acid.   August Saucer, Deshawn Witty 09/27/2011 5:19 PM

## 2011-09-27 NOTE — Evaluation (Addendum)
Physical Therapy Evaluation Patient Details Name: [name] MRN: [id number] DOB: Dec 19, 1935 Today's Date: 09/27/2011  Problem List: There is no problem list on file for this patient.   Past Medical History:  Past Medical History  Diagnosis Date  . Diabetes mellitus   . CHF (congestive heart failure)   . Asthma   . Osteoarthrosis, unspecified whether generalized or localized, unspecified site   . Chronic kidney disease, stage III (moderate)   . Obesity, unspecified   . Osteoarthrosis, unspecified whether generalized or localized, unspecified site   . Osteoporosis   . Depression   . Irregular heart beat   . OSA (obstructive sleep apnea)   . Sleep-related hypoventilation    Past Surgical History:  Past Surgical History  Procedure Date  . Knee surgery   . Back surgery   . Cardiac surgery     5 bipasses, carotid artery    PT Assessment/Plan/Recommendation PT Assessment Clinical Impression Statement: Pt presents with diagnosis of renal failure. Demonstrates SOB with ambulation, however pt reports he ambulates limited distances at baseline. Discussed possible need for RW, or at a minimum a straight cane, during ambulaiton. Pt stated he does not need to use a walker but that he occasionally will use a cane. Pt will benefit from skilled PT in acute setting to maximize independence, activity tolerance, and safety in preparation for d/c home. Discussed  follow-up therapy and explained that HHPT may be beneficial if agreeable.  PT Recommendation/Assessment: Patient will need skilled PT in the acute care venue PT Problem List: Decreased activity tolerance;Impaired sensation;Obesity;Decreased mobility PT Therapy Diagnosis : Difficulty walking PT Plan PT Frequency: Min 3X/week PT Treatment/Interventions: DME instruction;Gait training;Stair training;Functional mobility training;Therapeutic activities;Therapeutic exercise;Patient/family education PT Recommendation Recommendations for  Other Services: OT consult Follow Up Recommendations: Home health PT; Possibly cardiopulmonary rehab if MD agrees Equipment Recommended: None recommended by PT PT Goals  Acute Rehab PT Goals PT Goal Formulation: With patient Time For Goal Achievement: 2 weeks Pt will go Supine/Side to Sit: with modified independence PT Goal: Supine/Side to Sit - Progress: Goal set today Pt will go Sit to Supine/Side: with modified independence PT Goal: Sit to Supine/Side - Progress: Goal set today Pt will go Sit to Stand: with modified independence PT Goal: Sit to Stand - Progress: Goal set today Pt will Ambulate: 16 - 50 feet;with modified independence;with least restrictive assistive device PT Goal: Ambulate - Progress: Goal set today Pt will Go Up / Down Stairs: 3-5 stairs;with rail(s) (3 steps) PT Goal: Up/Down Stairs - Progress: Goal set today  PT Evaluation Precautions/Restrictions  Precautions Precautions: Fall Prior Functioning  Home Living Lives With: Alone Type of Home: House Home Access: Stairs to enter Secretary/administrator of Steps: 3+1 Entrance Stairs-Rails: Right Home Layout: One level Home Adaptive Equipment: Walker - rolling;Electric Scooter;Straight cane (lift chair) Prior Function Level of Independence: Independent (occasionally uses cane) Comments: Limited ambulation distances.  Cognition Cognition Arousal/Alertness: Awake/alert Overall Cognitive Status: Appears within functional limits for tasks assessed Orientation Level: Oriented X4 Sensation/Coordination Sensation Light Touch: Appears Intact Coordination Gross Motor Movements are Fluid and Coordinated: Yes Extremity Assessment RLE Strength RLE Overall Strength Comments: At least 3+/5 throughout during functional activity LLE Strength LLE Overall Strength Comments: At least 3+/5 throughout during functional activity Mobility (including Balance) Bed Mobility Bed Mobility: No Transfers Transfers: Yes Sit to  Stand: With upper extremity assist;With armrests;From chair/3-in-1;5: Supervision Sit to Stand Details (indicate cue type and reason): x3. Pt uses momentum to stand.  Stand to Sit:  5: Supervision;To chair/3-in-1;With armrests;With upper extremity assist Ambulation/Gait Ambulation/Gait: Yes Ambulation/Gait Assistance Details (indicate cue type and reason): Min-guard assist. Pt denies need for RW even though pt supports himself with 2 hands on IV pole. Fatigues easily. Dyspnea 3/4.  Ambulation Distance (Feet): 50 Feet (15'x2. 50'x1. Seated rest break in between) Assistive device: None (pushing IV pole) Gait Pattern: Step-through pattern;Antalgic;Decreased stride length  Posture/Postural Control Posture/Postural Control: No significant limitations Exercise    End of Session PT - End of Session Activity Tolerance: Patient limited by fatigue Patient left: in chair;with call bell in reach General Behavior During Session: [facility] for tasks performed Cognition: [facility] for tasks performed  Rebeca Alert [facility] 09/27/2011, 2:43 PM [phone]

## 2011-09-28 LAB — GLUCOSE, CAPILLARY
Glucose-Capillary: 90 mg/dL (ref 70–99)
Glucose-Capillary: 171 mg/dL — ABNORMAL HIGH (ref 70–99)
Glucose-Capillary: 167 mg/dL — ABNORMAL HIGH (ref 70–99)
Glucose-Capillary: 108 mg/dL — ABNORMAL HIGH (ref 70–99)

## 2011-09-28 LAB — CBC
MCH: 29.8 pg (ref 26.0–34.0)
MCHC: 34.1 g/dL (ref 30.0–36.0)
Platelets: 219 10*3/uL (ref 150–400)
RBC: 2.85 MIL/uL — ABNORMAL LOW (ref 4.22–5.81)
RDW: 13.4 % (ref 11.5–15.5)

## 2011-09-28 LAB — ANAEROBIC CULTURE: Gram Stain: NONE SEEN

## 2011-09-28 LAB — COMPREHENSIVE METABOLIC PANEL
ALT: 10 U/L (ref 0–53)
CO2: 18 mEq/L — ABNORMAL LOW (ref 19–32)
Calcium: 8.8 mg/dL (ref 8.4–10.5)
GFR calc Af Amer: 17 mL/min — ABNORMAL LOW (ref >90)
GFR calc non Af Amer: 15 mL/min — ABNORMAL LOW (ref >90)
Glucose, Bld: 98 mg/dL (ref 70–99)
Sodium: 135 mEq/L (ref 135–145)
Total Bilirubin: 0.2 mg/dL — ABNORMAL LOW (ref 0.3–1.2)

## 2011-09-28 LAB — DIFFERENTIAL
Basophils Absolute: 0 10*3/uL (ref 0.0–0.1)
Eosinophils Absolute: 0.5 10*3/uL (ref 0.0–0.7)
Eosinophils Relative: 6 % — ABNORMAL HIGH (ref 0–5)
Monocytes Absolute: 0.9 10*3/uL (ref 0.1–1.0)
Neutrophils Relative %: 68 % (ref 43–77)

## 2011-09-28 MED ORDER — FUROSEMIDE 40 MG PO TABS
40.0000 mg | ORAL_TABLET | Freq: Two times a day (BID) | ORAL | Status: DC
  Administered 2011-09-28 – 2011-10-03 (×10): 40 mg via ORAL
  Filled 2011-09-28 (×11): qty 1

## 2011-09-28 MED ORDER — DARBEPOETIN ALFA-POLYSORBATE 100 MCG/0.5ML IJ SOLN
100.0000 ug | INTRAMUSCULAR | Status: DC
  Administered 2011-09-28: 100 ug via SUBCUTANEOUS
  Filled 2011-09-28: qty 0.5

## 2011-09-28 NOTE — Progress Notes (Signed)
Subjective:  Patient reports he's feeling better overall. He has less exertional dyspnea. He still has some tenderness in his buttock area. He denies any fever chills or night sweats. Patient continues to be followed by surgery. He was seen by nephrology today as well.   Allergies  Allergen Reactions  . Penicillins     Pt denies  Allergy to me this may be an error  . Sulfa Antibiotics     Information from Childhood.  He didn't know reaction   Current Facility-Administered Medications  Medication Dose Route Frequency Provider Last Rate Last Dose  . 0.9 %  sodium chloride infusion   Intravenous Continuous Gwenyth Bender, MD 20 mL/hr at 09/26/11 1339 20 mL/hr at 09/26/11 1339  . albuterol (PROVENTIL HFA;VENTOLIN HFA) 108 (90 BASE) MCG/ACT inhaler 2 puff  2 puff Inhalation Q6H PRN Dorothea Ogle, MD      . albuterol (PROVENTIL) (5 MG/ML) 0.5% nebulizer solution 2.5 mg  2.5 mg Nebulization Q2H PRN Dorothea Ogle, MD   2.5 mg at 09/21/11 2238  . aspirin EC tablet 81 mg  81 mg Oral Daily Dorothea Ogle, MD   81 mg at 09/28/11 1007  . budesonide-formoterol (SYMBICORT) 160-4.5 MCG/ACT inhaler 2 puff  2 puff Inhalation BID Dorothea Ogle, MD   2 puff at 09/28/11 1949  . carvedilol (COREG) tablet 25 mg  25 mg Oral BID WC Dorothea Ogle, MD   25 mg at 09/28/11 1648  . darbepoetin (ARANESP) injection 100 mcg  100 mcg Subcutaneous Q Thu-1800 Cecille Aver, MD   100 mcg at 09/28/11 1718  . furosemide (LASIX) tablet 40 mg  40 mg Oral BID Gwenyth Bender, MD   40 mg at 09/28/11 1722  . HYDROcodone-acetaminophen (NORCO) 5-325 MG per tablet 1-2 tablet  1-2 tablet Oral Q4H PRN Dorothea Ogle, MD   2 tablet at 09/27/11 1718  . insulin aspart (novoLOG) injection 0-15 Units  0-15 Units Subcutaneous Q4H Dorothea Ogle, MD   2 Units at 09/28/11 2020  . insulin glargine (LANTUS) injection 20 Units  20 Units Subcutaneous BID Dorothea Ogle, MD   20 Units at 09/28/11 2129  . levothyroxine (SYNTHROID, LEVOTHROID) tablet 50 mcg   50 mcg Oral QAC breakfast Dorothea Ogle, MD   50 mcg at 09/28/11 [phone]  . linagliptin (TRADJENTA) tablet 5 mg  5 mg Oral Daily Dorothea Ogle, MD   5 mg at 09/28/11 1007  . morphine injection 2-4 mg  2-4 mg Intravenous Q2H PRN Adolph Pollack, MD      . simvastatin (ZOCOR) tablet 10 mg  10 mg Oral [id number] Dorothea Ogle, MD   10 mg at 09/28/11 1718  . sodium chloride 0.9 % injection 3 mL  3 mL Intravenous Q12H Dorothea Ogle, MD   3 mL at 09/28/11 2129  . Tamsulosin HCl (FLOMAX) capsule 0.4 mg  0.4 mg Oral Daily Dorothea Ogle, MD   0.4 mg at 09/28/11 1007  . vancomycin (VANCOCIN) 2,000 mg in sodium chloride 0.9 % 500 mL IVPB  2,000 mg Intravenous Q48H Lorenza Evangelist, PHARMD   2,000 mg at 09/28/11 0000  . DISCONTD: furosemide (LASIX) injection 40 mg  40 mg Intravenous Q12H Gwenyth Bender, MD   40 mg at 09/28/11 0554  . DISCONTD: potassium chloride 20 MEQ/15ML (10%) liquid 40 mEq  40 mEq Oral BID Gwenyth Bender, MD   40 mEq at 09/27/11 2134  Objective: Blood pressure 146/69, pulse 64, temperature 97.3 F (36.3 C), temperature source Oral, resp. rate 18, height 5\' 10"  (1.778 m), weight 337 lb 8.4 oz (153.1 kg), SpO2 98.00%.  Well-developed overweight white male in no acute distress. HEENT:no sinus tenderness. NECK:no posterior cervical nodes. No JVD at 60. LUNGS:mildly diminished breath sounds but otherwise clear. No vocal fremitus. WU:JWJXBJ S1, S2 without S3. YNW:GNFAO, nontender. ZHY:QMVHQ pitting edema. Negative Homans. NEURO:intact.  Lab results: Results for orders placed during the hospital encounter of 09/21/11 (from the past 48 hour(s))  GLUCOSE, CAPILLARY     Status: Abnormal   Collection Time   09/26/11 11:40 PM      Component Value Range Comment   Glucose-Capillary 128 (*) 70 - 99 (mg/dL)    Comment 1 Documented in Chart      Comment 2 Notify RN     GLUCOSE, CAPILLARY     Status: Abnormal   Collection Time   09/27/11  3:54 AM      Component Value Range Comment    Glucose-Capillary 150 (*) 70 - 99 (mg/dL)   COMPREHENSIVE METABOLIC PANEL     Status: Abnormal   Collection Time   09/27/11  4:40 AM      Component Value Range Comment   Sodium 137  135 - 145 (mEq/L)    Potassium 4.9  3.5 - 5.1 (mEq/L)    Chloride 108  96 - 112 (mEq/L)    CO2 18 (*) 19 - 32 (mEq/L)    Glucose, Bld 116 (*) 70 - 99 (mg/dL)    BUN 49 (*) 6 - 23 (mg/dL)    Creatinine, Ser 4.69 (*) 0.50 - 1.35 (mg/dL)    Calcium 8.6  8.4 - 10.5 (mg/dL)    Total Protein 7.2  6.0 - 8.3 (g/dL)    Albumin 2.7 (*) 3.5 - 5.2 (g/dL)    AST 5  0 - 37 (U/L)    ALT 9  0 - 53 (U/L)    Alkaline Phosphatase 126 (*) 39 - 117 (U/L)    Total Bilirubin 0.2 (*) 0.3 - 1.2 (mg/dL)    GFR calc non Af Amer 17 (*) >90 (mL/min)    GFR calc Af Amer 19 (*) >90 (mL/min)   PRO B NATRIURETIC PEPTIDE     Status: Abnormal   Collection Time   09/27/11  4:40 AM      Component Value Range Comment   Pro B Natriuretic peptide (BNP) 5409.0 (*) 0 - 125 (pg/mL)   CBC     Status: Abnormal   Collection Time   09/27/11  4:40 AM      Component Value Range Comment   WBC 8.5  4.0 - 10.5 (K/uL)    RBC 3.06 (*) 4.22 - 5.81 (MIL/uL)    Hemoglobin 9.1 (*) 13.0 - 17.0 (g/dL)    HCT 62.9 (*) 52.8 - 52.0 (%)    MCV 87.3  78.0 - 100.0 (fL)    MCH 29.7  26.0 - 34.0 (pg)    MCHC 34.1  30.0 - 36.0 (g/dL)    RDW 41.3  24.4 - 01.0 (%)    Platelets 217  150 - 400 (K/uL)   DIFFERENTIAL     Status: Normal   Collection Time   09/27/11  4:40 AM      Component Value Range Comment   Neutrophils Relative 73  43 - 77 (%)    Neutro Abs 6.2  1.7 - 7.7 (K/uL)    Lymphocytes Relative 15  12 - 46 (%)    Lymphs Abs 1.3  0.7 - 4.0 (K/uL)    Monocytes Relative 7  3 - 12 (%)    Monocytes Absolute 0.6  0.1 - 1.0 (K/uL)    Eosinophils Relative 5  0 - 5 (%)    Eosinophils Absolute 0.4  0.0 - 0.7 (K/uL)    Basophils Relative 0  0 - 1 (%)    Basophils Absolute 0.0  0.0 - 0.1 (K/uL)   GLUCOSE, CAPILLARY     Status: Normal   Collection Time   09/27/11   7:44 AM      Component Value Range Comment   Glucose-Capillary 93  70 - 99 (mg/dL)   GLUCOSE, CAPILLARY     Status: Abnormal   Collection Time   09/27/11 12:14 PM      Component Value Range Comment   Glucose-Capillary 137 (*) 70 - 99 (mg/dL)   GLUCOSE, CAPILLARY     Status: Abnormal   Collection Time   09/27/11  4:31 PM      Component Value Range Comment   Glucose-Capillary 141 (*) 70 - 99 (mg/dL)   GLUCOSE, CAPILLARY     Status: Abnormal   Collection Time   09/27/11  7:50 PM      Component Value Range Comment   Glucose-Capillary 147 (*) 70 - 99 (mg/dL)   GLUCOSE, CAPILLARY     Status: Abnormal   Collection Time   09/27/11 11:59 PM      Component Value Range Comment   Glucose-Capillary 138 (*) 70 - 99 (mg/dL)   GLUCOSE, CAPILLARY     Status: Normal   Collection Time   09/28/11  4:19 AM      Component Value Range Comment   Glucose-Capillary 96  70 - 99 (mg/dL)   COMPREHENSIVE METABOLIC PANEL     Status: Abnormal   Collection Time   09/28/11  4:50 AM      Component Value Range Comment   Sodium 135  135 - 145 (mEq/L)    Potassium 5.2 (*) 3.5 - 5.1 (mEq/L)    Chloride 108  96 - 112 (mEq/L)    CO2 18 (*) 19 - 32 (mEq/L)    Glucose, Bld 98  70 - 99 (mg/dL)    BUN 54 (*) 6 - 23 (mg/dL)    Creatinine, Ser 1.61 (*) 0.50 - 1.35 (mg/dL)    Calcium 8.8  8.4 - 10.5 (mg/dL)    Total Protein 6.8  6.0 - 8.3 (g/dL)    Albumin 2.6 (*) 3.5 - 5.2 (g/dL)    AST 6  0 - 37 (U/L)    ALT 10  0 - 53 (U/L)    Alkaline Phosphatase 118 (*) 39 - 117 (U/L)    Total Bilirubin 0.2 (*) 0.3 - 1.2 (mg/dL)    GFR calc non Af Amer 15 (*) >90 (mL/min)    GFR calc Af Amer 17 (*) >90 (mL/min)   PRO B NATRIURETIC PEPTIDE     Status: Abnormal   Collection Time   09/28/11  4:50 AM      Component Value Range Comment   Pro B Natriuretic peptide (BNP) 3444.0 (*) 0 - 125 (pg/mL)   CBC     Status: Abnormal   Collection Time   09/28/11  4:50 AM      Component Value Range Comment   WBC 7.9  4.0 - 10.5 (K/uL)    RBC  2.85 (*) 4.22 - 5.81 (MIL/uL)  Hemoglobin 8.5 (*) 13.0 - 17.0 (g/dL)    HCT 08.6 (*) 57.8 - 52.0 (%)    MCV 87.4  78.0 - 100.0 (fL)    MCH 29.8  26.0 - 34.0 (pg)    MCHC 34.1  30.0 - 36.0 (g/dL)    RDW 46.9  62.9 - 52.8 (%)    Platelets 219  150 - 400 (K/uL)   DIFFERENTIAL     Status: Abnormal   Collection Time   09/28/11  4:50 AM      Component Value Range Comment   Neutrophils Relative 68  43 - 77 (%)    Lymphocytes Relative 15  12 - 46 (%)    Monocytes Relative 11  3 - 12 (%)    Eosinophils Relative 6 (*) 0 - 5 (%)    Basophils Relative 0  0 - 1 (%)    Neutro Abs 5.3  1.7 - 7.7 (K/uL)    Lymphs Abs 1.2  0.7 - 4.0 (K/uL)    Monocytes Absolute 0.9  0.1 - 1.0 (K/uL)    Eosinophils Absolute 0.5  0.0 - 0.7 (K/uL)    Basophils Absolute 0.0  0.0 - 0.1 (K/uL)    Smear Review MORPHOLOGY UNREMARKABLE     GLUCOSE, CAPILLARY     Status: Normal   Collection Time   09/28/11  7:37 AM      Component Value Range Comment   Glucose-Capillary 90  70 - 99 (mg/dL)   GLUCOSE, CAPILLARY     Status: Abnormal   Collection Time   09/28/11 10:20 AM      Component Value Range Comment   Glucose-Capillary 171 (*) 70 - 99 (mg/dL)   GLUCOSE, CAPILLARY     Status: Abnormal   Collection Time   09/28/11 11:37 AM      Component Value Range Comment   Glucose-Capillary 167 (*) 70 - 99 (mg/dL)   GLUCOSE, CAPILLARY     Status: Abnormal   Collection Time   09/28/11  4:46 PM      Component Value Range Comment   Glucose-Capillary 108 (*) 70 - 99 (mg/dL)     Studies/Results: No results found.  Patient Active Problem List  Diagnoses  . Diabetes mellitus  . COPD with respiratory distress, acute  . CHF (congestive heart failure), NYHA class III  . Abscess of cellulitis of buttock  . Renal failure (ARF), acute on chronic    Impression: Staph aureus cellulitis and buttock abscesses. Antibiotic therapy continues. Acute on chronic renal failure. COPD with respiratory distress improved. ProBNP  decreasing. Congestive heart failure. Diabetes mellitus.   Plan: Continue present therapy. IV furosemide changed to p.o. And 40 mg b.i.d. Followup his CMET, proBNP in a.m. Continue followup with nephrology as an outpatient. Followup his SPEP    August Saucer, Karel Mowers 09/28/2011 10:05 PM

## 2011-09-28 NOTE — Progress Notes (Signed)
5 Days Post-Op  Subjective: No problems with buttocks   Objective: Vital signs in last 24 hours: Temp:  [97.4 F (36.3 C)-97.9 F (36.6 C)] 97.4 F (36.3 C) (04/18 0800) Pulse Rate:  [56-66] 66  (04/18 0800) Resp:  [16-20] 18  (04/18 0800) BP: (164-185)/(71-90) 185/84 mmHg (04/18 0800) SpO2:  [98 %-100 %] 100 % (04/18 0800) Weight:  [153.1 kg (337 lb 8.4 oz)] 153.1 kg (337 lb 8.4 oz) (04/18 1191) Last BM Date: 09/27/11  Intake/Output from previous day: 04/17 0701 - 04/18 0700 In: 1780 [P.O.:840; I.V.:440; IV Piggyback:500] Out: 3865 [Urine:3865] Intake/Output this shift: Total I/O In: 270 [P.O.:270] Out: 950 [Urine:950]  Incision/Wound: I took dressing down, it is clean no new fluctuant areas.    Lab Results:   Basename 09/28/11 0450 09/27/11 0440  WBC 7.9 8.5  HGB 8.5* 9.1*  HCT 24.9* 26.7*  PLT 219 217    BMET  Basename 09/28/11 0450 09/27/11 0440  NA 135 137  K 5.2* 4.9  CL 108 108  CO2 18* 18*  GLUCOSE 98 116*  BUN 54* 49*  CREATININE 3.72* 3.40*  CALCIUM 8.8 8.6   PT/INR No results found for this basename: LABPROT:2,INR:2 in the last 72 hours   Lab 09/28/11 0450 09/27/11 0440 09/26/11 0623 09/21/11 2205  AST 6 5 <5 <5  ALT 10 9 7 8   ALKPHOS 118* 126* 110 156*  BILITOT 0.2* 0.2* 0.2* 0.4  PROT 6.8 7.2 6.3 8.1  ALBUMIN 2.6* 2.7* 2.3* 3.0*     Lipase  No results found for this basename: lipase     Studies/Results: No results found.  Medications:    . aspirin EC  81 mg Oral Daily  . budesonide-formoterol  2 puff Inhalation BID  . carvedilol  25 mg Oral BID WC  . darbepoetin (ARANESP) injection - NON-DIALYSIS  100 mcg Subcutaneous Q Thu-1800  . furosemide  40 mg Oral BID  . insulin aspart  0-15 Units Subcutaneous Q4H  . insulin glargine  20 Units Subcutaneous BID  . levothyroxine  50 mcg Oral QAC breakfast  . linagliptin  5 mg Oral Daily  . simvastatin  10 mg Oral q1800  . sodium chloride  3 mL Intravenous Q12H  . Tamsulosin HCl  0.4  mg Oral Daily  . vancomycin  2,000 mg Intravenous Q48H  . DISCONTD: furosemide  40 mg Intravenous Q12H  . DISCONTD: potassium chloride  40 mEq Oral BID    Assessment/Plan Status post incision and drainage of buttock abscess It looks very good . Will need wet to dry dressing till it heals.  Plan:  Continue wet to dry dressings, bid. He can shower and then have dressing change, from our standpoint.  He will need home health care to help with dressing changes.  I told him he may need to have someone help him with BID dressing changes.  I doubt HH will come out more than once a day.  If he goes home over weekend I will put follow up information in the AVS.      LOS: 7 days    Jolyne Laye 09/28/2011

## 2011-09-28 NOTE — Progress Notes (Addendum)
ANTIBIOTIC CONSULT NOTE - FOLLOW UP  Pharmacy Consult for Vancomycin Indication: R buttock abscess - MSSA  Allergies  Allergen Reactions  . Penicillins     Pt denies  Allergy to me this may be an error  . Sulfa Antibiotics     Information from Childhood.  He didn't know reaction    Patient Measurements: Height: 5\' 10"  (177.8 cm) Weight: 337 lb 8.4 oz (153.1 kg) IBW/kg (Calculated) : 73   Vital Signs: Temp: 97.9 F (36.6 C) (04/18 2130) Temp src: Oral (04/18 8657) BP: 167/90 mmHg (04/18 8469) Pulse Rate: 56  (04/18 6295) Intake/Output from previous day: 04/17 0701 - 04/18 0700 In: 1780 [P.O.:840; I.V.:440; IV Piggyback:500] Out: 3865 [Urine:3865] Intake/Output from this shift: Total I/O In: 240 [P.O.:240] Out: -   Labs:  Basename 09/28/11 0450 09/27/11 0440 09/26/11 0623  WBC 7.9 8.5 --  HGB 8.5* 9.1* --  PLT 219 217 --  LABCREA -- -- --  CREATININE 3.72* 3.40* 3.38*   Estimated Creatinine Clearance: 26.3 ml/min (by C-G formula based on Cr of 3.72).  Basename 09/26/11 0005  VANCOTROUGH 18.9  VANCOPEAK --  VANCORANDOM --  GENTTROUGH --  GENTPEAK --  GENTRANDOM --  TOBRATROUGH --  TOBRAPEAK --  TOBRARND --  AMIKACINPEAK --  AMIKACINTROU --  AMIKACIN --    Assessment:  76 yo F currently on Day #7 of Vancomycin for R buttock wound s/p I&D.    Patient with acute renal failure. Per MD "review of record indicate that pt baseline SCr since 2008 was 1.5 - 2.0 and this is new acute elevation". Scr increasing today.  4/13 abscess culture with staph aureus - oxacillin sensitive (MSSA).    VT therapeutic on 4/15.  Afebrile, WBC wnl.  Goal of Therapy:  Vancomycin trough level 15-20 mcg/ml  Plan:   MD, please consider narrowing antibiotic given MSSA from buttock cultures and rising Scr on Vanc.  Other than oxacillin, staph is sensitive to levofloxacin, moxifloxacin and Bactrim/Septra.  If pt is stable for oral abx, recommend transition to Bactrim DS q12hrs.                                                                          Geoffry Paradise Thi 09/28/2011,9:03 AM

## 2011-09-28 NOTE — Consult Note (Signed)
[facility] Renal Consultation Note  Requesting MD: Dr. Willey Blade Indication for Consultation: Creatinine in the 3's with unknown baseline  HPI: [name] is a 76 y.o. male with past medical history significant for diabetes mellitus patient says less than 10 years without retinopathy or neuropathy., obesity, OSA,  coronary artery disease status post CABG . as well as hypertension. There also appears to be history with medical noncompliance. The last kidney function numbers that we have in the system are from 2008 at which time his creatinine was in the 1.4-1.6 range. He now presents for management of a buttock abscess that has required operative treatment. His creatinine has been noted to be in of 3.4-3.7 range since admission here.  Urine output seems reasonable in fact excessive on only Lasix 40 mg PO twice a day.  He was supposedly taking ramipril as an outpatient but is not on it at present.  He admits he has not been taking his ramipril.  Workup so far has been a renal ultrasound showing large kidneys with apparently normal echotexture.  Urinalysis shows greater than 300 of protein with no blood.  24 hour urine as well as multiple myeloma screen is pending.  He denies ever hearing anything about his kidney function even when he was hospitalized at high 0.4 his CABG. He denies any history of excessive nonsteroidal anti-inflammatory drug use. He states he felt better since being hospitalized. He denies any excessive fatigue or any appetite symptoms that would be consistent with uremia.   Creatinine, Ser  Date/Time Value Range Status  09/28/2011  4:50 AM 3.72* 0.50-1.35 (mg/dL) Final  1/61/0960  4:54 AM 3.40* 0.50-1.35 (mg/dL) Final  0/98/1191  4:78 AM 3.38* 0.50-1.35 (mg/dL) Final  2/95/6213  0:86 AM 3.55* 0.50-1.35 (mg/dL) Final  5/78/4696  2:95 AM 3.70* 0.50-1.35 (mg/dL) Final  2/84/1324  4:01 PM 3.78* 0.50-1.35 (mg/dL) Final  07/19/2534  6:44 AM 1.41   Final  03/17/2007   3:20 AM 1.61*  Final  03/16/2007  3:43 AM 1.65*  Final  03/14/2007 11:16 PM 1.21   Final  01/04/2007  7:55 AM 1.31   Final     PMHx:   Past Medical History  Diagnosis Date  . Diabetes mellitus   . CHF (congestive heart failure)   . Asthma   . Osteoarthrosis, unspecified whether generalized or localized, unspecified site   . Chronic kidney disease, stage III (moderate)   . Obesity, unspecified   . Osteoarthrosis, unspecified whether generalized or localized, unspecified site   . Osteoporosis   . Depression   . Irregular heart beat   . OSA (obstructive sleep apnea)   . Sleep-related hypoventilation     Past Surgical History  Procedure Date  . Knee surgery   . Back surgery   . Cardiac surgery     5 bipasses, carotid artery    Family Hx: History reviewed. No pertinent family history.  Social History:  reports that he has never smoked. He has never used smokeless tobacco. He reports that he drinks alcohol. He reports that he does not use illicit drugs.  Allergies:  Allergies  Allergen Reactions  . Penicillins     Pt denies  Allergy to me this may be an error  . Sulfa Antibiotics     Information from Childhood.  He didn't know reaction    Medications: Prior to Admission medications   Medication Sig Start Date End Date Taking? Authorizing Provider  albuterol (PROVENTIL HFA;VENTOLIN HFA) 108 (90 BASE) MCG/ACT inhaler  Inhale 2 puffs into the lungs every 6 (six) hours as needed. For shortness of breath.   Yes Historical Provider, MD  aspirin EC 81 MG tablet Take 81 mg by mouth daily.   Yes Historical Provider, MD  budesonide-formoterol (SYMBICORT) 160-4.5 MCG/ACT inhaler Inhale 2 puffs into the lungs 2 (two) times daily.   Yes Historical Provider, MD  carvedilol (COREG) 25 MG tablet Take 25 mg by mouth 2 (two) times daily with a meal.   Yes Historical Provider, MD  furosemide (LASIX) 40 MG tablet Take 40 mg by mouth daily.    Yes Historical Provider, MD  insulin glargine  (LANTUS) 100 UNIT/ML injection Inject 20 Units into the skin 2 (two) times daily.    Yes Historical Provider, MD  levalbuterol (XOPENEX) 1.25 MG/3ML nebulizer solution Take 1.25 mg by nebulization every 4 (four) hours as needed. For shortness of breath.   Yes Historical Provider, MD  levothyroxine (SYNTHROID, LEVOTHROID) 50 MCG tablet Take 50 mcg by mouth daily.   Yes Historical Provider, MD  lovastatin (MEVACOR) 40 MG tablet Take 40 mg by mouth 2 (two) times daily.   Yes Historical Provider, MD  ramipril (ALTACE) 10 MG capsule Take 10 mg by mouth 2 (two) times daily.   Yes Historical Provider, MD  sitaGLIPtin (JANUVIA) 100 MG tablet Take 100 mg by mouth daily.   Yes Historical Provider, MD  Tamsulosin HCl (FLOMAX) 0.4 MG CAPS Take 0.4 mg by mouth daily.   Yes Historical Provider, MD    I have reviewed the patient's current medications.  Labs:  Results for orders placed during the hospital encounter of 09/21/11 (from the past 48 hour(s))  GLUCOSE, CAPILLARY     Status: Abnormal   Collection Time   09/26/11 11:59 AM      Component Value Range Comment   Glucose-Capillary 152 (*) 70 - 99 (mg/dL)   GLUCOSE, CAPILLARY     Status: Abnormal   Collection Time   09/26/11  4:42 PM      Component Value Range Comment   Glucose-Capillary 143 (*) 70 - 99 (mg/dL)   GLUCOSE, CAPILLARY     Status: Abnormal   Collection Time   09/26/11  8:10 PM      Component Value Range Comment   Glucose-Capillary 138 (*) 70 - 99 (mg/dL)   GLUCOSE, CAPILLARY     Status: Abnormal   Collection Time   09/26/11 11:40 PM      Component Value Range Comment   Glucose-Capillary 128 (*) 70 - 99 (mg/dL)    Comment 1 Documented in Chart      Comment 2 Notify RN     GLUCOSE, CAPILLARY     Status: Abnormal   Collection Time   09/27/11  3:54 AM      Component Value Range Comment   Glucose-Capillary 150 (*) 70 - 99 (mg/dL)   COMPREHENSIVE METABOLIC PANEL     Status: Abnormal   Collection Time   09/27/11  4:40 AM      Component  Value Range Comment   Sodium 137  135 - 145 (mEq/L)    Potassium 4.9  3.5 - 5.1 (mEq/L)    Chloride 108  96 - 112 (mEq/L)    CO2 18 (*) 19 - 32 (mEq/L)    Glucose, Bld 116 (*) 70 - 99 (mg/dL)    BUN 49 (*) 6 - 23 (mg/dL)    Creatinine, Ser 1.61 (*) 0.50 - 1.35 (mg/dL)    Calcium 8.6  8.4 -  10.5 (mg/dL)    Total Protein 7.2  6.0 - 8.3 (g/dL)    Albumin 2.7 (*) 3.5 - 5.2 (g/dL)    AST 5  0 - 37 (U/L)    ALT 9  0 - 53 (U/L)    Alkaline Phosphatase 126 (*) 39 - 117 (U/L)    Total Bilirubin 0.2 (*) 0.3 - 1.2 (mg/dL)    GFR calc non Af Amer 17 (*) >90 (mL/min)    GFR calc Af Amer 19 (*) >90 (mL/min)   PRO B NATRIURETIC PEPTIDE     Status: Abnormal   Collection Time   09/27/11  4:40 AM      Component Value Range Comment   Pro B Natriuretic peptide (BNP) 5409.0 (*) 0 - 125 (pg/mL)   CBC     Status: Abnormal   Collection Time   09/27/11  4:40 AM      Component Value Range Comment   WBC 8.5  4.0 - 10.5 (K/uL)    RBC 3.06 (*) 4.22 - 5.81 (MIL/uL)    Hemoglobin 9.1 (*) 13.0 - 17.0 (g/dL)    HCT 95.6 (*) 21.3 - 52.0 (%)    MCV 87.3  78.0 - 100.0 (fL)    MCH 29.7  26.0 - 34.0 (pg)    MCHC 34.1  30.0 - 36.0 (g/dL)    RDW 08.6  57.8 - 46.9 (%)    Platelets 217  150 - 400 (K/uL)   DIFFERENTIAL     Status: Normal   Collection Time   09/27/11  4:40 AM      Component Value Range Comment   Neutrophils Relative 73  43 - 77 (%)    Neutro Abs 6.2  1.7 - 7.7 (K/uL)    Lymphocytes Relative 15  12 - 46 (%)    Lymphs Abs 1.3  0.7 - 4.0 (K/uL)    Monocytes Relative 7  3 - 12 (%)    Monocytes Absolute 0.6  0.1 - 1.0 (K/uL)    Eosinophils Relative 5  0 - 5 (%)    Eosinophils Absolute 0.4  0.0 - 0.7 (K/uL)    Basophils Relative 0  0 - 1 (%)    Basophils Absolute 0.0  0.0 - 0.1 (K/uL)   GLUCOSE, CAPILLARY     Status: Normal   Collection Time   09/27/11  7:44 AM      Component Value Range Comment   Glucose-Capillary 93  70 - 99 (mg/dL)   GLUCOSE, CAPILLARY     Status: Abnormal   Collection Time    09/27/11 12:14 PM      Component Value Range Comment   Glucose-Capillary 137 (*) 70 - 99 (mg/dL)   GLUCOSE, CAPILLARY     Status: Abnormal   Collection Time   09/27/11  4:31 PM      Component Value Range Comment   Glucose-Capillary 141 (*) 70 - 99 (mg/dL)   GLUCOSE, CAPILLARY     Status: Abnormal   Collection Time   09/27/11  7:50 PM      Component Value Range Comment   Glucose-Capillary 147 (*) 70 - 99 (mg/dL)   GLUCOSE, CAPILLARY     Status: Abnormal   Collection Time   09/27/11 11:59 PM      Component Value Range Comment   Glucose-Capillary 138 (*) 70 - 99 (mg/dL)   GLUCOSE, CAPILLARY     Status: Normal   Collection Time   09/28/11  4:19 AM  Component Value Range Comment   Glucose-Capillary 96  70 - 99 (mg/dL)   COMPREHENSIVE METABOLIC PANEL     Status: Abnormal   Collection Time   09/28/11  4:50 AM      Component Value Range Comment   Sodium 135  135 - 145 (mEq/L)    Potassium 5.2 (*) 3.5 - 5.1 (mEq/L)    Chloride 108  96 - 112 (mEq/L)    CO2 18 (*) 19 - 32 (mEq/L)    Glucose, Bld 98  70 - 99 (mg/dL)    BUN 54 (*) 6 - 23 (mg/dL)    Creatinine, Ser 2.84 (*) 0.50 - 1.35 (mg/dL)    Calcium 8.8  8.4 - 10.5 (mg/dL)    Total Protein 6.8  6.0 - 8.3 (g/dL)    Albumin 2.6 (*) 3.5 - 5.2 (g/dL)    AST 6  0 - 37 (U/L)    ALT 10  0 - 53 (U/L)    Alkaline Phosphatase 118 (*) 39 - 117 (U/L)    Total Bilirubin 0.2 (*) 0.3 - 1.2 (mg/dL)    GFR calc non Af Amer 15 (*) >90 (mL/min)    GFR calc Af Amer 17 (*) >90 (mL/min)   PRO B NATRIURETIC PEPTIDE     Status: Abnormal   Collection Time   09/28/11  4:50 AM      Component Value Range Comment   Pro B Natriuretic peptide (BNP) 3444.0 (*) 0 - 125 (pg/mL)   CBC     Status: Abnormal   Collection Time   09/28/11  4:50 AM      Component Value Range Comment   WBC 7.9  4.0 - 10.5 (K/uL)    RBC 2.85 (*) 4.22 - 5.81 (MIL/uL)    Hemoglobin 8.5 (*) 13.0 - 17.0 (g/dL)    HCT 13.2 (*) 44.0 - 52.0 (%)    MCV 87.4  78.0 - 100.0 (fL)    MCH 29.8   26.0 - 34.0 (pg)    MCHC 34.1  30.0 - 36.0 (g/dL)    RDW 10.2  72.5 - 36.6 (%)    Platelets 219  150 - 400 (K/uL)   DIFFERENTIAL     Status: Abnormal   Collection Time   09/28/11  4:50 AM      Component Value Range Comment   Neutrophils Relative 68  43 - 77 (%)    Lymphocytes Relative 15  12 - 46 (%)    Monocytes Relative 11  3 - 12 (%)    Eosinophils Relative 6 (*) 0 - 5 (%)    Basophils Relative 0  0 - 1 (%)    Neutro Abs 5.3  1.7 - 7.7 (K/uL)    Lymphs Abs 1.2  0.7 - 4.0 (K/uL)    Monocytes Absolute 0.9  0.1 - 1.0 (K/uL)    Eosinophils Absolute 0.5  0.0 - 0.7 (K/uL)    Basophils Absolute 0.0  0.0 - 0.1 (K/uL)    Smear Review MORPHOLOGY UNREMARKABLE     GLUCOSE, CAPILLARY     Status: Normal   Collection Time   09/28/11  7:37 AM      Component Value Range Comment   Glucose-Capillary 90  70 - 99 (mg/dL)   GLUCOSE, CAPILLARY     Status: Abnormal   Collection Time   09/28/11 10:20 AM      Component Value Range Comment   Glucose-Capillary 171 (*) 70 - 99 (mg/dL)      ROS: Positive  for shortness of breath and dyspnea on exertion. Positive for excessive large edema which has improved through the course of the hospitalization. Positive for pain related to the buttock abscess. Patient denies any nausea or vomiting, fevers, chills, chest pain, abdominal pain. The remainder of the review of systems is negative     Physical Exam: Filed Vitals:   09/28/11 0800  BP: 185/84  Pulse: 66  Temp: 97.4 F (36.3 C)  Resp: 18     General:   Alert and oriented, obese Sitting up in the bedside chair eating lunch. Very social. "Hey sweetie"  HEENT: Pupils are equally round reactive to light, extraocular motions are intact, mucous membranes are moist  Neck: there is no jugular venous distention, carotid bruits or lymphadenopathy.  Heart: regular rate and rhythm without murmur, gallop, or rub  Lungs: obese, some expiratory wheezing. Mostly clear to auscultation.  Abdomen: obese, soft, nontender,  nondistended.  Extremities: maybe trace to 1+ edema right now. Patient reports improved since hospitalization.  His buttock abscess was not examined by me  Neuro: Alert and oriented. The remainder of neurologic exam is nonfocal.   Assessment/Plan: 76 year old white male with multiple medical issues including diabetes, hypertension, coronary artery disease. His renal function baseline is unknown but now appears to be in the mid threes indicating a GFR in the teens.  I suspect that this has been a chronic progressive issue given the fact that there is in some medical noncompliance. 1.Renal- seems to be mostly consistent with chronic kidney disease. I agree with workup to date. I suspect his CKD is on the basis of diabetes and hypertension both of which have been poorly controlled. His renal ultrasound does not indicate any reversibility. I agree with checking an SPEP and UPEP which are pending at this time. Not sure if there's any more testing to be done.  He hopefully has an element of an acute component with this recent buttock abscess.  Fortunately he doesn't appear to be having any uremic symptoms. I think we need to continue to let the dust settle here to see where his kidney function is going to end up. He definitely will need outpatient followup which will be arranged at the time of discharge. 2. Hypertension/volume  - patient is diuresing nicely on oral Lasix. I will discontinue potassium replacement as potassium is up.  I see no reason to change the dose of lasix  today. I would hesitate to start him on an ACE or an ARB is given how advanced his CKD is. The horse may be out of the barn as far as that goes. He is on Coreg as well. We could consider adding Norvasc if additional blood pressure control as needed.  4. Anemia  - he has anemia and that's another reason I think that his chronic kidney disease is long-standing. I will start Aranesp and check iron stores.  5. buttock abscess-currently on  vancomycin and getting dressing changes. Per primary team.  6. Dispo- His renal insufficiency should not keep him in the hospital if he is ready for discharge by other parameters.  I will follow while here and arrange follow up with me at [facility] at the time of discharge.    [name] A 09/28/2011, 11:29 AM

## 2011-09-28 NOTE — Progress Notes (Signed)
General Surgery [facility] - Attending  Per Will Marlyne Beards note.  Continue dressing changes as instructed.  If patient still in house, will see early next week.  Velora Heckler, MD, [facility] Office: [phone]

## 2011-09-29 LAB — COMPREHENSIVE METABOLIC PANEL
AST: 6 U/L (ref 0–37)
Albumin: 2.7 g/dL — ABNORMAL LOW (ref 3.5–5.2)
BUN: 59 mg/dL — ABNORMAL HIGH (ref 6–23)
Chloride: 105 mEq/L (ref 96–112)
Creatinine, Ser: 3.55 mg/dL — ABNORMAL HIGH (ref 0.50–1.35)
Potassium: 4.8 mEq/L (ref 3.5–5.1)
Total Protein: 7.3 g/dL (ref 6.0–8.3)

## 2011-09-29 LAB — PRO B NATRIURETIC PEPTIDE: Pro B Natriuretic peptide (BNP): 2203 pg/mL — ABNORMAL HIGH (ref 0–125)

## 2011-09-29 LAB — DIFFERENTIAL
Eosinophils Relative: 5 % (ref 0–5)
Lymphocytes Relative: 17 % (ref 12–46)
Lymphs Abs: 1.5 10*3/uL (ref 0.7–4.0)
Monocytes Absolute: 0.8 10*3/uL (ref 0.1–1.0)
Monocytes Relative: 9 % (ref 3–12)
Neutro Abs: 6 10*3/uL (ref 1.7–7.7)

## 2011-09-29 LAB — PROTEIN, URINE, 24 HOUR
Collection Interval-UPROT: 24 hours
Protein, Urine: 91 mg/dL

## 2011-09-29 LAB — FERRITIN: Ferritin: 212 ng/mL (ref 22–322)

## 2011-09-29 LAB — CBC
HCT: 26.3 % — ABNORMAL LOW (ref 39.0–52.0)
Hemoglobin: 8.9 g/dL — ABNORMAL LOW (ref 13.0–17.0)
MCV: 87.4 fL (ref 78.0–100.0)
WBC: 8.7 10*3/uL (ref 4.0–10.5)

## 2011-09-29 LAB — GLUCOSE, CAPILLARY: Glucose-Capillary: 124 mg/dL — ABNORMAL HIGH (ref 70–99)

## 2011-09-29 LAB — IRON AND TIBC
Saturation Ratios: 18 % — ABNORMAL LOW (ref 20–55)
UIBC: 216 ug/dL (ref 125–400)

## 2011-09-29 LAB — PHOSPHORUS: Phosphorus: 5.4 mg/dL — ABNORMAL HIGH (ref 2.3–4.6)

## 2011-09-29 MED ORDER — INSULIN ASPART 100 UNIT/ML ~~LOC~~ SOLN: 0.0000 [IU] | Freq: Three times a day (TID) | SUBCUTANEOUS | Status: DC

## 2011-09-29 MED ORDER — INSULIN ASPART 100 UNIT/ML ~~LOC~~ SOLN
0.0000 [IU] | Freq: Three times a day (TID) | SUBCUTANEOUS | Status: DC
  Administered 2011-09-30 (×2): 3 [IU] via SUBCUTANEOUS
  Administered 2011-10-01 (×2): 2 [IU] via SUBCUTANEOUS
  Administered 2011-10-02 (×2): 3 [IU] via SUBCUTANEOUS

## 2011-09-29 MED ORDER — CALCIUM CARBONATE ANTACID 500 MG PO CHEW
1.0000 | CHEWABLE_TABLET | Freq: Three times a day (TID) | ORAL | Status: DC
  Administered 2011-09-29 – 2011-10-03 (×11): 200 mg via ORAL
  Filled 2011-09-29 (×14): qty 1

## 2011-09-29 MED ORDER — INSULIN ASPART 100 UNIT/ML ~~LOC~~ SOLN
0.0000 [IU] | Freq: Every day | SUBCUTANEOUS | Status: DC
  Administered 2011-09-30 – 2011-10-01 (×2): 3 [IU] via SUBCUTANEOUS

## 2011-09-29 NOTE — Progress Notes (Signed)
MD, please consider narrowing antibiotic given MSSA from buttock cultures and elevated Scr on Vanc. Other than oxacillin, staph is sensitive to levofloxacin, moxifloxacin and Bactrim/Septra. If pt is stable for oral abx, recommend transition to Bactrim DS q12hrs.   Thank you  Geoffry Paradise, PharmD.  Pager:  [phone] 1:08 PM

## 2011-09-29 NOTE — Progress Notes (Signed)
   CARE MANAGEMENT NOTE 09/29/2011  Patient:  [name], [name]   Account Number:  [id number]  Date Initiated:  09/23/2011  Documentation initiated by:  DAVIS,TYMEEKA  Subjective/Objective Assessment:   76 yo male admitted with renal failure, S/P right buttock abscess I&D. PCP Dr.Dean.     Action/Plan:   Home vs Rehab   Anticipated DC Date:  09/30/2011   Anticipated DC Plan:  HOME W HOME HEALTH SERVICES  In-house referral  NA      DC Planning Services  CM consult      Choice offered to / List presented to:  C-1 Patient   DME arranged  NA      DME agency  NA     HH arranged  NA      HH agency  NA   Status of service:  In process, will continue to follow Medicare Important Message given?   (If response is "NO", the following Medicare IM given date fields will be blank) Date Medicare IM given:   Date Additional Medicare IM given:    Discharge Disposition:    Per UR Regulation:    If discussed at Long Length of Stay Meetings, dates discussed:    Comments:  09/29/11 Georgi Navarrete RN,BSN NCM [phone] [facility] CHOSEN, & INFORMED FOR HHRN-DSG CHANGES.WILL NEED HHRN ORDERS, & F2F.NOTED NEPHROLOGY NOTE.  09/26/11 Luree Palla RN,BSN NCM [phone] PATIENT ASKING ABOUT INPT REHAB.DR. August Saucer AWARE,& PLANS TO ORDER PT EVAL.[facility] WAS USED IN THE PAST, & [facility] DME FOR HOME 02.PATIENT HAS ALL DME EXCEPT A 3N1.  09/23/11 1347 [name] [phone] Cm spoke with pt with adukt daughter who is an Rn at bedside. CM consulted concerning medication assistance, per daughter pt unable to afford current medication regimen. Pt has Medicare A&B, and AARP. Pt states med copays too expensive. Pt currently taking 20 home meds, only 4 on generic list. CM provided pt's daughter with [facility] generic drug list, [email or web address] applications for seveal brand name meds. pt and daughter encouraged to ask MD to switch meds to generic. Pt and daughter advised to have MD assist with needymeds applications prior to  dc. Pt eligible for indigent funds. Pt eligible for outpt heart failure medication assistance. Pt daughter request HHRN upon d/c. Pt and daughter given choice list for [facility].

## 2011-09-29 NOTE — Progress Notes (Signed)
Subjective:  Patient reports he's feeling better overall. He did ambulate with physical therapy today. He tolerated this well. He continues to have some mild discomfort in his buttock region. No other new complaints. Appetite has been good.   Allergies  Allergen Reactions  . Penicillins     Pt denies  Allergy to me this may be an error  . Sulfa Antibiotics     Information from Childhood.  He didn't know reaction   Current Facility-Administered Medications  Medication Dose Route Frequency Provider Last Rate Last Dose  . 0.9 %  sodium chloride infusion   Intravenous Continuous Gwenyth Bender, MD 20 mL/hr at 09/26/11 1339 20 mL/hr at 09/26/11 1339  . albuterol (PROVENTIL HFA;VENTOLIN HFA) 108 (90 BASE) MCG/ACT inhaler 2 puff  2 puff Inhalation Q6H PRN Dorothea Ogle, MD      . albuterol (PROVENTIL) (5 MG/ML) 0.5% nebulizer solution 2.5 mg  2.5 mg Nebulization Q2H PRN Dorothea Ogle, MD   2.5 mg at 09/21/11 2238  . aspirin EC tablet 81 mg  81 mg Oral Daily Dorothea Ogle, MD   81 mg at 09/29/11 0906  . budesonide-formoterol (SYMBICORT) 160-4.5 MCG/ACT inhaler 2 puff  2 puff Inhalation BID Dorothea Ogle, MD   2 puff at 09/29/11 1936  . calcium carbonate (TUMS - dosed in mg elemental calcium) chewable tablet 200 mg of elemental calcium  1 tablet Oral TID WC Cecille Aver, MD   200 mg of elemental calcium at 09/29/11 1809  . carvedilol (COREG) tablet 25 mg  25 mg Oral BID WC Dorothea Ogle, MD   25 mg at 09/29/11 1622  . darbepoetin (ARANESP) injection 100 mcg  100 mcg Subcutaneous Q Thu-1800 Cecille Aver, MD   100 mcg at 09/28/11 1718  . furosemide (LASIX) tablet 40 mg  40 mg Oral BID Gwenyth Bender, MD   40 mg at 09/29/11 1809  . HYDROcodone-acetaminophen (NORCO) 5-325 MG per tablet 1-2 tablet  1-2 tablet Oral Q4H PRN Dorothea Ogle, MD   1 tablet at 09/29/11 1809  . insulin aspart (novoLOG) injection 0-15 Units  0-15 Units Subcutaneous TID WC Gwenyth Bender, MD      . insulin aspart (novoLOG)  injection 0-5 Units  0-5 Units Subcutaneous QHS Gwenyth Bender, MD      . insulin glargine (LANTUS) injection 20 Units  20 Units Subcutaneous BID Dorothea Ogle, MD   20 Units at 09/29/11 0904  . levothyroxine (SYNTHROID, LEVOTHROID) tablet 50 mcg  50 mcg Oral QAC breakfast Dorothea Ogle, MD   50 mcg at 09/29/11 0850  . linagliptin (TRADJENTA) tablet 5 mg  5 mg Oral Daily Dorothea Ogle, MD   5 mg at 09/29/11 0906  . morphine injection 2-4 mg  2-4 mg Intravenous Q2H PRN Adolph Pollack, MD      . simvastatin (ZOCOR) tablet 10 mg  10 mg Oral J1914 Dorothea Ogle, MD   10 mg at 09/29/11 1809  . sodium chloride 0.9 % injection 3 mL  3 mL Intravenous Q12H Dorothea Ogle, MD   3 mL at 09/28/11 2129  . Tamsulosin HCl (FLOMAX) capsule 0.4 mg  0.4 mg Oral Daily Dorothea Ogle, MD   0.4 mg at 09/29/11 0906  . vancomycin (VANCOCIN) 2,000 mg in sodium chloride 0.9 % 500 mL IVPB  2,000 mg Intravenous Q48H Lorenza Evangelist, PHARMD   2,000 mg at 09/28/11 0000  . DISCONTD: insulin  aspart (novoLOG) injection 0-15 Units  0-15 Units Subcutaneous Q4H Dorothea Ogle, MD   3 Units at 09/29/11 1622  . DISCONTD: insulin aspart (novoLOG) injection 0-15 Units  0-15 Units Subcutaneous TID WC & HS Gwenyth Bender, MD      . DISCONTD: insulin aspart (novoLOG) injection 0-15 Units  0-15 Units Subcutaneous TID WC Gwenyth Bender, MD        Objective: Blood pressure 138/75, pulse 55, temperature 97.3 F (36.3 C), temperature source Oral, resp. rate 20, height 5\' 10"  (1.778 m), weight 329 lb 9.4 oz (149.5 kg), SpO2 99.00%.  Well-developed overweight white male in no acute distress. HEENT: No sinus tenderness. NECK: No posterior cervical nodes. LUNGS: Clear to auscultation. No wheezes. Slightly diminished breath sounds. No vocal fremitus. CV: Normal S1, S2 without S3. ABD: Obese, nontender. MSK: Negative Homans. No edema. NEURO: Intact.  Lab results: Results for orders placed during the hospital encounter of 09/21/11 (from the past 48  hour(s))  GLUCOSE, CAPILLARY     Status: Abnormal   Collection Time   09/27/11 11:59 PM      Component Value Range Comment   Glucose-Capillary 138 (*) 70 - 99 (mg/dL)   GLUCOSE, CAPILLARY     Status: Normal   Collection Time   09/28/11  4:19 AM      Component Value Range Comment   Glucose-Capillary 96  70 - 99 (mg/dL)   COMPREHENSIVE METABOLIC PANEL     Status: Abnormal   Collection Time   09/28/11  4:50 AM      Component Value Range Comment   Sodium 135  135 - 145 (mEq/L)    Potassium 5.2 (*) 3.5 - 5.1 (mEq/L)    Chloride 108  96 - 112 (mEq/L)    CO2 18 (*) 19 - 32 (mEq/L)    Glucose, Bld 98  70 - 99 (mg/dL)    BUN 54 (*) 6 - 23 (mg/dL)    Creatinine, Ser 4.09 (*) 0.50 - 1.35 (mg/dL)    Calcium 8.8  8.4 - 10.5 (mg/dL)    Total Protein 6.8  6.0 - 8.3 (g/dL)    Albumin 2.6 (*) 3.5 - 5.2 (g/dL)    AST 6  0 - 37 (U/L)    ALT 10  0 - 53 (U/L)    Alkaline Phosphatase 118 (*) 39 - 117 (U/L)    Total Bilirubin 0.2 (*) 0.3 - 1.2 (mg/dL)    GFR calc non Af Amer 15 (*) >90 (mL/min)    GFR calc Af Amer 17 (*) >90 (mL/min)   PRO B NATRIURETIC PEPTIDE     Status: Abnormal   Collection Time   09/28/11  4:50 AM      Component Value Range Comment   Pro B Natriuretic peptide (BNP) 3444.0 (*) 0 - 125 (pg/mL)   CBC     Status: Abnormal   Collection Time   09/28/11  4:50 AM      Component Value Range Comment   WBC 7.9  4.0 - 10.5 (K/uL)    RBC 2.85 (*) 4.22 - 5.81 (MIL/uL)    Hemoglobin 8.5 (*) 13.0 - 17.0 (g/dL)    HCT 81.1 (*) 91.4 - 52.0 (%)    MCV 87.4  78.0 - 100.0 (fL)    MCH 29.8  26.0 - 34.0 (pg)    MCHC 34.1  30.0 - 36.0 (g/dL)    RDW 78.2  95.6 - 21.3 (%)    Platelets 219  150 - 400 (  K/uL)   DIFFERENTIAL     Status: Abnormal   Collection Time   09/28/11  4:50 AM      Component Value Range Comment   Neutrophils Relative 68  43 - 77 (%)    Lymphocytes Relative 15  12 - 46 (%)    Monocytes Relative 11  3 - 12 (%)    Eosinophils Relative 6 (*) 0 - 5 (%)    Basophils Relative 0  0  - 1 (%)    Neutro Abs 5.3  1.7 - 7.7 (K/uL)    Lymphs Abs 1.2  0.7 - 4.0 (K/uL)    Monocytes Absolute 0.9  0.1 - 1.0 (K/uL)    Eosinophils Absolute 0.5  0.0 - 0.7 (K/uL)    Basophils Absolute 0.0  0.0 - 0.1 (K/uL)    Smear Review MORPHOLOGY UNREMARKABLE     GLUCOSE, CAPILLARY     Status: Normal   Collection Time   09/28/11  7:37 AM      Component Value Range Comment   Glucose-Capillary 90  70 - 99 (mg/dL)   GLUCOSE, CAPILLARY     Status: Abnormal   Collection Time   09/28/11 10:20 AM      Component Value Range Comment   Glucose-Capillary 171 (*) 70 - 99 (mg/dL)   GLUCOSE, CAPILLARY     Status: Abnormal   Collection Time   09/28/11 11:37 AM      Component Value Range Comment   Glucose-Capillary 167 (*) 70 - 99 (mg/dL)   PROTEIN, URINE, 24 HOUR     Status: Abnormal   Collection Time   09/28/11  2:21 PM      Component Value Range Comment   Urine Total Volume-UPROT 5220      Collection Interval-UPROT 24      Protein, Urine 91      Protein, 24H Urine 4750 (*) 50 - 100 (mg/day)   GLUCOSE, CAPILLARY     Status: Abnormal   Collection Time   09/28/11  4:46 PM      Component Value Range Comment   Glucose-Capillary 108 (*) 70 - 99 (mg/dL)   GLUCOSE, CAPILLARY     Status: Abnormal   Collection Time   09/28/11  8:07 PM      Component Value Range Comment   Glucose-Capillary 137 (*) 70 - 99 (mg/dL)   GLUCOSE, CAPILLARY     Status: Abnormal   Collection Time   09/28/11 11:57 PM      Component Value Range Comment   Glucose-Capillary 189 (*) 70 - 99 (mg/dL)   GLUCOSE, CAPILLARY     Status: Abnormal   Collection Time   09/29/11  3:42 AM      Component Value Range Comment   Glucose-Capillary 127 (*) 70 - 99 (mg/dL)   COMPREHENSIVE METABOLIC PANEL     Status: Abnormal   Collection Time   09/29/11  4:45 AM      Component Value Range Comment   Sodium 135  135 - 145 (mEq/L)    Potassium 4.8  3.5 - 5.1 (mEq/L)    Chloride 105  96 - 112 (mEq/L)    CO2 19  19 - 32 (mEq/L)    Glucose, Bld 114  (*) 70 - 99 (mg/dL)    BUN 59 (*) 6 - 23 (mg/dL)    Creatinine, Ser 9.81 (*) 0.50 - 1.35 (mg/dL)    Calcium 8.9  8.4 - 10.5 (mg/dL)    Total Protein 7.3  6.0 - 8.3 (  g/dL)    Albumin 2.7 (*) 3.5 - 5.2 (g/dL)    AST 6  0 - 37 (U/L)    ALT 12  0 - 53 (U/L)    Alkaline Phosphatase 125 (*) 39 - 117 (U/L)    Total Bilirubin 0.2 (*) 0.3 - 1.2 (mg/dL)    GFR calc non Af Amer 16 (*) >90 (mL/min)    GFR calc Af Amer 18 (*) >90 (mL/min)   PRO B NATRIURETIC PEPTIDE     Status: Abnormal   Collection Time   09/29/11  4:45 AM      Component Value Range Comment   Pro B Natriuretic peptide (BNP) 2203.0 (*) 0 - 125 (pg/mL)   CBC     Status: Abnormal   Collection Time   09/29/11  4:45 AM      Component Value Range Comment   WBC 8.7  4.0 - 10.5 (K/uL)    RBC 3.01 (*) 4.22 - 5.81 (MIL/uL)    Hemoglobin 8.9 (*) 13.0 - 17.0 (g/dL)    HCT 16.1 (*) 09.6 - 52.0 (%)    MCV 87.4  78.0 - 100.0 (fL)    MCH 29.6  26.0 - 34.0 (pg)    MCHC 33.8  30.0 - 36.0 (g/dL)    RDW 04.5  40.9 - 81.1 (%)    Platelets 233  150 - 400 (K/uL)   DIFFERENTIAL     Status: Normal   Collection Time   09/29/11  4:45 AM      Component Value Range Comment   Neutrophils Relative 69  43 - 77 (%)    Neutro Abs 6.0  1.7 - 7.7 (K/uL)    Lymphocytes Relative 17  12 - 46 (%)    Lymphs Abs 1.5  0.7 - 4.0 (K/uL)    Monocytes Relative 9  3 - 12 (%)    Monocytes Absolute 0.8  0.1 - 1.0 (K/uL)    Eosinophils Relative 5  0 - 5 (%)    Eosinophils Absolute 0.5  0.0 - 0.7 (K/uL)    Basophils Relative 0  0 - 1 (%)    Basophils Absolute 0.0  0.0 - 0.1 (K/uL)   IRON AND TIBC     Status: Abnormal   Collection Time   09/29/11  4:45 AM      Component Value Range Comment   Iron 46  42 - 135 (ug/dL)    TIBC 914  782 - 956 (ug/dL)    Saturation Ratios 18 (*) 20 - 55 (%)    UIBC 216  125 - 400 (ug/dL)   FERRITIN     Status: Normal   Collection Time   09/29/11  4:45 AM      Component Value Range Comment   Ferritin 212  22 - 322 (ng/mL)     PHOSPHORUS     Status: Abnormal   Collection Time   09/29/11  4:45 AM      Component Value Range Comment   Phosphorus 5.4 (*) 2.3 - 4.6 (mg/dL)   GLUCOSE, CAPILLARY     Status: Abnormal   Collection Time   09/29/11  8:40 AM      Component Value Range Comment   Glucose-Capillary 124 (*) 70 - 99 (mg/dL)   GLUCOSE, CAPILLARY     Status: Abnormal   Collection Time   09/29/11 11:56 AM      Component Value Range Comment   Glucose-Capillary 117 (*) 70 - 99 (mg/dL)    Comment 1  Notify RN      Comment 2 Documented in Chart     GLUCOSE, CAPILLARY     Status: Abnormal   Collection Time   09/29/11  4:11 PM      Component Value Range Comment   Glucose-Capillary 181 (*) 70 - 99 (mg/dL)    Comment 1 Documented in Chart      Comment 2 Notify RN       Studies/Results: No results found.  Patient Active Problem List  Diagnoses  . Diabetes mellitus  . COPD with respiratory distress, acute  . CHF (congestive heart failure), NYHA class III  . Abscess of cellulitis of buttock  . Renal failure (ARF), acute on chronic    Impression: Buttock abscess status post I&D. Staph aureus infection with the vancomycin therapy ongoing. Diabetes mellitus with gradual decrease in blood sugars. Chronic kidney disease stage IV. Nephrotic range proteinuria. Anemia of chronic disease. Rule out occult loss. COPD History congestive heart failure. Coronary artery disease status post CABG. Degenerative joint disease history.   Plan: Continue present therapy. Complete 7-10 day course of the IV antibiotics. Followup sedimentation rate in a.m. Renal followup for nephrotic proteinuria. Increase activity as tolerated. Guiac stools times 3. Ferrous gluconate.   August Saucer, Danese Dorsainvil 09/29/2011 8:47 PM

## 2011-09-29 NOTE — Progress Notes (Signed)
Placed pt on nasal cpap for rest, 14cm h2o per home settings with 2l o2 bleedin, pt is tolerating well at this time.  RN aware.

## 2011-09-29 NOTE — Progress Notes (Signed)
Physical Therapy Treatment Patient Details Name: [name] MRN: [id number] DOB: 10/17/1935 Today's Date: 09/29/2011 Time: 7829-5621 PT Time Calculation (min): 21 min  PT Assessment / Plan / Recommendation Comments on Treatment Session  Monitored pt's O2 sats on RA.  Pt was 100% sitting before ambulation, and 100% after gait training with a cane. Pt was min guard assist with transfers and ambulation.  Pt. was DOE 2/4.  Pt. had to be instructed to sit down in chair during ambulation after 30 ft.  VC were given for UE placement on chair to sit and stand for safety.                                 Mobility  Transfers Sit to Stand: From chair/3-in-1;4: Min guard (vc to push off with hand) Stand to Sit: 4: Min guard;To chair/3-in-1 (vc needed for use of UE) Ambulation/Gait Ambulation/Gait Assistance: 4: Min guard Ambulation Distance (Feet): 60 Feet (30 X 2.  seated rest break) Assistive device: Straight cane Ambulation/Gait Assistance Details: Pt stated he has cane at home. DOE 2/4.  Gait Pattern: Step-through pattern;Decreased stride length (distance limited due to dyspnea/fatigue) Gait velocity: decreased  Stairs: No Wheelchair Mobility Wheelchair Mobility: No     PT Goals Acute Rehab PT Goals PT Goal Formulation: With patient Pt will go Sit to Stand: with modified independence PT Goal: Sit to Stand - Progress: Progressing toward goal Pt will Ambulate: 16 - 50 feet;with modified independence;with least restrictive assistive device PT Goal: Ambulate - Progress: Progressing toward goal  Visit Information  Last PT Received On: 09/29/11 Assistance Needed: +1    Subjective Data  Subjective: Pt had no c/o pain, O2 sats 100% on RA sitting and after ambulation.       End of Session :   Pt was left sitting in chair with call light and phone in reach.    Hiram Comber, SPTA 09/29/2011, 1:19 PM  Felecia Shelling  PTA [facility]  Acute  Rehab Pager     [phone]

## 2011-09-29 NOTE — Progress Notes (Signed)
Subjective:  No new complaints.  Does not know when going home.  24 hour urine collection a little over 4 grams, could still be consistent with diabetic nephropathy.  SPEP and UPEP pending.  Great UOP on decreased dose of lasix, creatinine poor but stable Objective Vital signs in last 24 hours: Filed Vitals:   09/28/11 1459 09/28/11 1949 09/28/11 2215 09/29/11 0515  BP: 146/69  158/64 153/83  Pulse: 64  67 65  Temp: 97.3 F (36.3 C)  98 F (36.7 C) 97.6 F (36.4 C)  TempSrc: Oral  Oral Oral  Resp: 18  18 20   Height:      Weight:    149.5 kg (329 lb 9.4 oz)  SpO2: 100% 98% 100% 100%   Weight change: -3.6 kg (-7 lb 15 oz)  Intake/Output Summary (Last 24 hours) at 09/29/11 1215 Last data filed at 09/29/11 0900  Gross per 24 hour  Intake   1430 ml  Output   3575 ml  Net  -2145 ml   Labs: Basic Metabolic Panel:  Lab 09/29/11 0981 09/28/11 0450 09/27/11 0440  NA 135 135 137  K 4.8 5.2* 4.9  CL 105 108 108  CO2 19 18* 18*  GLUCOSE 114* 98 116*  BUN 59* 54* 49*  CREATININE 3.55* 3.72* 3.40*  CALCIUM 8.9 8.8 8.6  ALB -- -- --  PHOS 5.4* -- --   Liver Function Tests:  Lab 09/29/11 0445 09/28/11 0450 09/27/11 0440  AST 6 6 5   ALT 12 10 9   ALKPHOS 125* 118* 126*  BILITOT 0.2* 0.2* 0.2*  PROT 7.3 6.8 7.2  ALBUMIN 2.7* 2.6* 2.7*   No results found for this basename: LIPASE:3,AMYLASE:3 in the last 168 hours No results found for this basename: AMMONIA:3 in the last 168 hours CBC:  Lab 09/29/11 0445 09/28/11 0450 09/27/11 0440  WBC 8.7 7.9 8.5  NEUTROABS 6.0 5.3 6.2  HGB 8.9* 8.5* 9.1*  HCT 26.3* 24.9* 26.7*  MCV 87.4 87.4 87.3  PLT 233 219 217   Cardiac Enzymes:  Lab 09/22/11 1350  CKTOTAL 64  CKMB 2.3  CKMBINDEX --  TROPONINI <0.30   CBG:  Lab 09/29/11 0840 09/29/11 0342 09/28/11 2357 09/28/11 2007 09/28/11 1646  GLUCAP 124* 127* 189* 137* 108*    Iron Studies:  Basename 09/29/11 0445  IRON --  TIBC --  TRANSFERRIN --  FERRITIN 212    Studies/Results: No results found. Medications: Infusions:    . sodium chloride 20 mL/hr (09/26/11 1339)    Scheduled Medications:    . aspirin EC  81 mg Oral Daily  . budesonide-formoterol  2 puff Inhalation BID  . carvedilol  25 mg Oral BID WC  . darbepoetin (ARANESP) injection - NON-DIALYSIS  100 mcg Subcutaneous Q Thu-1800  . furosemide  40 mg Oral BID  . insulin aspart  0-15 Units Subcutaneous Q4H  . insulin glargine  20 Units Subcutaneous BID  . levothyroxine  50 mcg Oral QAC breakfast  . linagliptin  5 mg Oral Daily  . simvastatin  10 mg Oral q1800  . sodium chloride  3 mL Intravenous Q12H  . Tamsulosin HCl  0.4 mg Oral Daily  . vancomycin  2,000 mg Intravenous Q48H    have reviewed scheduled and prn medications.  Physical Exam: General: alert, pleasant, NAD Heart: RRR Lungs: mostly clear Abdomen:soft, non tender Extremities: minimal edema, improved per the patient   I Assessment/ Plan: Pt is a 76 y.o. yo male who was admitted on 09/21/2011 with  buttock abscess but found to have elevated creatinine (baseline unknown)  Assessment/Plan: 1. Elevated creatinine- SPEP and UPEP are pending.  Otherwise I strongly suspect diabetic nephropathy and that this is a chronic issue.  Kidney function is poor but stable.  There are no indications for HD at this time.  I am hoping that when patient gets better from the infection standpoint that his creatinine may improve.  Nothing else to do acutely at this time. 2. Anemia- anemic, have given dose of aranesp and iron stores are pending.  3. Abscess- cultures grew MSSA- per primary team, may be able to simplify antibiotic to something he can take as OP 4. Secondary hyperparathyroidism- phos a little high and calcium low, will start TUMS with meals and check an intact PTH 5. HTN/volume- on coreg/lasix.  I am hesitant to start ACE or ARB, would add norvasc if more control continues to be needed.  Will eval tomorrow and not add yet.   6. Dispo- per primary team.  He does not need to stay in hospital for renal reasons.     Jadrien Narine A   09/29/2011,12:15 PM  LOS: 8 days

## 2011-09-30 LAB — COMPREHENSIVE METABOLIC PANEL
ALT: 12 U/L (ref 0–53)
AST: 6 U/L (ref 0–37)
Alkaline Phosphatase: 118 U/L — ABNORMAL HIGH (ref 39–117)
CO2: 19 mEq/L (ref 19–32)
Calcium: 8.7 mg/dL (ref 8.4–10.5)
Chloride: 103 mEq/L (ref 96–112)
GFR calc non Af Amer: 16 mL/min — ABNORMAL LOW (ref >90)
Potassium: 4.8 mEq/L (ref 3.5–5.1)
Sodium: 133 mEq/L — ABNORMAL LOW (ref 135–145)

## 2011-09-30 LAB — DIFFERENTIAL
Basophils Absolute: 0 10*3/uL (ref 0.0–0.1)
Lymphs Abs: 1.6 10*3/uL (ref 0.7–4.0)
Monocytes Absolute: 0.7 10*3/uL (ref 0.1–1.0)
Neutrophils Relative %: 69 % (ref 43–77)

## 2011-09-30 LAB — CBC
MCH: 29.7 pg (ref 26.0–34.0)
MCV: 86.8 fL (ref 78.0–100.0)
Platelets: 221 10*3/uL (ref 150–400)
RDW: 13.2 % (ref 11.5–15.5)
WBC: 9 10*3/uL (ref 4.0–10.5)

## 2011-09-30 LAB — GLUCOSE, CAPILLARY
Glucose-Capillary: 107 mg/dL — ABNORMAL HIGH (ref 70–99)
Glucose-Capillary: 165 mg/dL — ABNORMAL HIGH (ref 70–99)
Glucose-Capillary: 151 mg/dL — ABNORMAL HIGH (ref 70–99)
Glucose-Capillary: 169 mg/dL — ABNORMAL HIGH (ref 70–99)

## 2011-09-30 MED ORDER — NA FERRIC GLUC CPLX IN SUCROSE 12.5 MG/ML IV SOLN
125.0000 mg | Freq: Every day | INTRAVENOUS | Status: AC
  Administered 2011-09-30 – 2011-10-01 (×2): 125 mg via INTRAVENOUS
  Filled 2011-09-30 (×2): qty 10

## 2011-09-30 NOTE — Progress Notes (Signed)
Placed pt on nasal cpap for rest, 14cm h2o per home settings with 2l o2 bleedin, pt tolerating well at this time.

## 2011-09-30 NOTE — Progress Notes (Signed)
Patient was not physically seen by me today.  I see that situation is stable, good UOP, stable creatinine.  I also see that his iron stores are low, so will be supplementing with IV iron in addition to the aranesp.  I am working on OP appt for him as well.  Will re-eval on Monday but will look at parameters in the computer tomorrow as well.    Call with any questions over weekend  [phone]  Caileen Veracruz A

## 2011-09-30 NOTE — Progress Notes (Signed)
Subjective:  Patient denies any chest pain or shortness of breath. Denies any pain in the right buttock area at the site of incision and drainage and dressing is dry  Objective:  Vital Signs in the last 24 hours: Temp:  [97.3 F (36.3 C)-97.9 F (36.6 C)] 97.4 F (36.3 C) (04/20 0501) Pulse Rate:  [55-80] 80  (04/20 0835) Resp:  [20] 20  (04/20 0501) BP: (122-152)/(64-79) 122/64 mmHg (04/20 0835) SpO2:  [99 %-100 %] 100 % (04/20 0846) FiO2 (%):  [21 %] 21 % (04/20 0846) Weight:  [149.052 kg (328 lb 9.6 oz)] 149.052 kg (328 lb 9.6 oz) (04/20 0501)  Intake/Output from previous day: 04/19 0701 - 04/20 0700 In: 960 [P.O.:960] Out: 2425 [Urine:2425] Intake/Output from this shift: Total I/O In: -  Out: 150 [Urine:150]  Physical Exam: Neck: no adenopathy, no carotid bruit, no JVD and supple, symmetrical, trachea midline Lungs: clear to auscultation bilaterally Heart: regular rate and rhythm, S1, S2 normal and Soft systolic murmur noted no S3 gallop no rub Abdomen: soft, non-tender; bowel sounds normal; no masses,  no organomegaly Extremities: extremities normal, atraumatic, no cyanosis or edema  Lab Results:  Basename 09/30/11 0452 09/29/11 0445  WBC 9.0 8.7  HGB 8.8* 8.9*  PLT 221 233    Basename 09/30/11 0452 09/29/11 0445  NA 133* 135  K 4.8 4.8  CL 103 105  CO2 19 19  GLUCOSE 135* 114*  BUN 58* 59*  CREATININE 3.49* 3.55*   No results found for this basename: TROPONINI:2,CK,MB:2 in the last 72 hours Hepatic Function Panel  Basename 09/30/11 0452  PROT 7.1  ALBUMIN 2.7*  AST 6  ALT 12  ALKPHOS 118*  BILITOT 0.2*  BILIDIR --  IBILI --   No results found for this basename: CHOL in the last 72 hours No results found for this basename: PROTIME in the last 72 hours  Imaging: Imaging results have been reviewed and No results found.  Cardiac Studies:  Assessment/Plan:  Right buttock abscess status post I&D Coronary artery disease status post  CABG Hypertension Diabetes matters Acute on chronic kidney disease stage IV Anemia of chronic disease COPD Morbid obesity Ischemic cardiomyopathy Degenerative joint disease Plan Continue present management Check labs in a.m.  LOS: 9 days    Dexton Zwilling N 09/30/2011, 12:51 PM

## 2011-10-01 LAB — COMPREHENSIVE METABOLIC PANEL
ALT: 11 U/L (ref 0–53)
AST: 6 U/L (ref 0–37)
Albumin: 2.7 g/dL — ABNORMAL LOW (ref 3.5–5.2)
Calcium: 8.6 mg/dL (ref 8.4–10.5)
GFR calc Af Amer: 18 mL/min — ABNORMAL LOW (ref >90)
Sodium: 135 mEq/L (ref 135–145)
Total Protein: 7 g/dL (ref 6.0–8.3)

## 2011-10-01 LAB — GLUCOSE, CAPILLARY
Glucose-Capillary: 130 mg/dL — ABNORMAL HIGH (ref 70–99)
Glucose-Capillary: 162 mg/dL — ABNORMAL HIGH (ref 70–99)

## 2011-10-01 LAB — DIFFERENTIAL
Basophils Relative: 0 % (ref 0–1)
Eosinophils Relative: 4 % (ref 0–5)
Monocytes Absolute: 0.9 10*3/uL (ref 0.1–1.0)
Neutrophils Relative %: 73 % (ref 43–77)

## 2011-10-01 LAB — CBC
HCT: 24.7 % — ABNORMAL LOW (ref 39.0–52.0)
Hemoglobin: 8.3 g/dL — ABNORMAL LOW (ref 13.0–17.0)
MCH: 29.6 pg (ref 26.0–34.0)
RBC: 2.8 MIL/uL — ABNORMAL LOW (ref 4.22–5.81)

## 2011-10-01 NOTE — Progress Notes (Signed)
Subjective:  Patient denies any chest pain or shortness of breath Denies any pain in right buttock area  Objective:  Vital Signs in the last 24 hours: Temp:  [97.8 F (36.6 C)-98.3 F (36.8 C)] 98.3 F (36.8 C) (04/21 0514) Pulse Rate:  [62-66] 65  (04/21 0802) Resp:  [16-20] 20  (04/21 0514) BP: (137-157)/(72-79) 137/77 mmHg (04/21 0802) SpO2:  [98 %-99 %] 98 % (04/21 1020) FiO2 (%):  [21 %] 21 % (04/21 1020) Weight:  [148.9 kg (328 lb 4.2 oz)] 148.9 kg (328 lb 4.2 oz) (04/21 0706)  Intake/Output from previous day: 04/20 0701 - 04/21 0700 In: 1310 [P.O.:960; I.V.:240; IV Piggyback:110] Out: 1801 [Urine:1800; Stool:1] Intake/Output from this shift:    Physical Exam: Neck: no adenopathy, no carotid bruit, no JVD and supple, symmetrical, trachea midline Lungs: clear to auscultation bilaterally Heart: regular rate and rhythm, S1, S2 normal, no murmur, click, rub or gallop Abdomen: soft, non-tender; bowel sounds normal; no masses,  no organomegaly Extremities: extremities normal, atraumatic, no cyanosis or edema  Lab Results:  Basename 10/01/11 0441 09/30/11 0452  WBC 9.3 9.0  HGB 8.3* 8.8*  PLT 203 221    Basename 10/01/11 0441 09/30/11 0452  NA 135 133*  K 4.7 4.8  CL 104 103  CO2 18* 19  GLUCOSE 135* 135*  BUN 62* 58*  CREATININE 3.60* 3.49*   No results found for this basename: TROPONINI:2,CK,MB:2 in the last 72 hours Hepatic Function Panel  Basename 10/01/11 0441  PROT 7.0  ALBUMIN 2.7*  AST 6  ALT 11  ALKPHOS 114  BILITOT 0.2*  BILIDIR --  IBILI --   No results found for this basename: CHOL in the last 72 hours No results found for this basename: PROTIME in the last 72 hours  Imaging: Imaging results have been reviewed and No results found.  Cardiac Studies:  Assessment/Plan:  Right buttock abscess status post I&D  Coronary artery disease status post CABG  Hypertension  Diabetes matters  Acute on chronic kidney disease stage IV  Anemia of  chronic disease  COPD  Morbid obesity  Ischemic cardiomyopathy  Degenerative joint disease Plan Continue present management   LOS: 10 days    [name] N 10/01/2011, 10:37 AM

## 2011-10-02 LAB — DIFFERENTIAL
Lymphocytes Relative: 16 % (ref 12–46)
Lymphs Abs: 1.5 10*3/uL (ref 0.7–4.0)
Monocytes Absolute: 0.8 10*3/uL (ref 0.1–1.0)
Monocytes Relative: 8 % (ref 3–12)
Neutro Abs: 7.1 10*3/uL (ref 1.7–7.7)
Neutrophils Relative %: 72 % (ref 43–77)

## 2011-10-02 LAB — GLUCOSE, CAPILLARY
Glucose-Capillary: 102 mg/dL — ABNORMAL HIGH (ref 70–99)
Glucose-Capillary: 160 mg/dL — ABNORMAL HIGH (ref 70–99)
Glucose-Capillary: 171 mg/dL — ABNORMAL HIGH (ref 70–99)
Glucose-Capillary: 139 mg/dL — ABNORMAL HIGH (ref 70–99)

## 2011-10-02 LAB — COMPREHENSIVE METABOLIC PANEL
AST: 6 U/L (ref 0–37)
Albumin: 2.9 g/dL — ABNORMAL LOW (ref 3.5–5.2)
Calcium: 8.9 mg/dL (ref 8.4–10.5)
Chloride: 103 mEq/L (ref 96–112)
Creatinine, Ser: 3.69 mg/dL — ABNORMAL HIGH (ref 0.50–1.35)

## 2011-10-02 LAB — PRO B NATRIURETIC PEPTIDE: Pro B Natriuretic peptide (BNP): 787.2 pg/mL — ABNORMAL HIGH (ref 0–125)

## 2011-10-02 LAB — CBC
HCT: 25.7 % — ABNORMAL LOW (ref 39.0–52.0)
Hemoglobin: 8.6 g/dL — ABNORMAL LOW (ref 13.0–17.0)
MCHC: 33.5 g/dL (ref 30.0–36.0)
RBC: 2.93 MIL/uL — ABNORMAL LOW (ref 4.22–5.81)

## 2011-10-02 LAB — VANCOMYCIN, TROUGH: Vancomycin Tr: <5 ug/mL — ABNORMAL LOW (ref 10.0–20.0)

## 2011-10-02 MED ORDER — VANCOMYCIN HCL 1000 MG IV SOLR
2000.0000 mg | INTRAVENOUS | Status: DC
  Filled 2011-10-02: qty 2000

## 2011-10-02 NOTE — Progress Notes (Signed)
Will need home health for dressing changes. Wound is doing well We will see the patient PRN while inpatient Follow-up instructions in EPIC.  Wilmon Arms. Corliss Skains, MD, [facility]  10/02/2011 8:56 AM

## 2011-10-02 NOTE — Progress Notes (Signed)
ANTIBIOTIC CONSULT NOTE - FOLLOW UP  Pharmacy Consult for Vancomycin Indication: R buttock abscess - MSSA   Allergies  Allergen Reactions  . Penicillins     Pt denies  Allergy to me this may be an error  . Sulfa Antibiotics     Information from Childhood.  He didn't know reaction    Patient Measurements: Height: 5\' 10"  (177.8 cm) Weight: 328 lb 4.2 oz (148.9 kg) IBW/kg (Calculated) : 73  Adjusted Body Weight:   Vital Signs: Temp: 97.9 F (36.6 C) (04/21 2245) Temp src: Oral (04/21 2245) BP: 146/60 mmHg (04/21 2245) Pulse Rate: 62  (04/21 2245) Intake/Output from previous day: 04/21 0701 - 04/22 0700 In: 480 [P.O.:480] Out: 625 [Urine:625] Intake/Output from this shift: Total I/O In: -  Out: 275 [Urine:275]  Labs:  [facility] 10/01/11 0441 09/30/11 0452 09/29/11 0445  WBC 9.3 9.0 8.7  HGB 8.3* 8.8* 8.9*  PLT 203 221 233  LABCREA -- -- --  CREATININE 3.60* 3.49* 3.55*   Estimated Creatinine Clearance: 26.7 ml/min (by C-G formula based on Cr of 3.6).  Basename 10/01/11 2319  VANCOTROUGH <5.0*  VANCOPEAK --  Drue Dun --  GENTTROUGH --  GENTPEAK --  GENTRANDOM --  TOBRATROUGH --  TOBRAPEAK --  TOBRARND --  AMIKACINPEAK --  AMIKACINTROU --  AMIKACIN --     Microbiology: Recent Results (from the past 720 hour(s))  URINE CULTURE     Status: Normal   Collection Time   09/21/11 10:20 PM      Component Value Range Status Comment   Specimen Description URINE, RANDOM   Final    Special Requests NONE   Final    Culture  Setup Time [id number]   Final    Colony Count >=100,000 COLONIES/ML   Final    Culture     Final    Value: GROUP B STREP(S.AGALACTIAE)ISOLATED     Note: TESTING AGAINST S. AGALACTIAE NOT ROUTINELY PERFORMED DUE TO PREDICTABILITY OF AMP/PEN/VAN SUSCEPTIBILITY.   Report Status 09/23/2011 FINAL   Final   CULTURE, ROUTINE-ABSCESS     Status: Normal   Collection Time   09/23/11 10:03 AM      Component Value Range Status Comment   Specimen  Description BUTTOCKS RIGHT   Final    Special Requests NONE   Final    Gram Stain     Final    Value: NO WBC SEEN     RARE SQUAMOUS EPITHELIAL CELLS PRESENT     FEW GRAM POSITIVE COCCI IN PAIRS   Culture     Final    Value: ABUNDANT STAPHYLOCOCCUS AUREUS     Note: RIFAMPIN AND GENTAMICIN SHOULD NOT BE USED AS SINGLE DRUGS FOR TREATMENT OF STAPH INFECTIONS. This organism is presumed to be Clindamycin resistant based on detection of inducible Clindamycin resistance.   Report Status 09/26/2011 FINAL   Final    Organism ID, Bacteria STAPHYLOCOCCUS AUREUS   Final   ANAEROBIC CULTURE     Status: Normal   Collection Time   09/23/11 10:04 AM      Component Value Range Status Comment   Specimen Description BUTTOCKS RIGHT   Final    Special Requests NONE   Final    Gram Stain     Final    Value: NO WBC SEEN     RARE SQUAMOUS EPITHELIAL CELLS PRESENT     FEW GRAM POSITIVE COCCI IN PAIRS   Culture NO ANAEROBES ISOLATED   Final    Report Status 09/28/2011  FINAL   Final     Anti-infectives     Start     Dose/Rate Route Frequency Ordered Stop   10/03/11 0000   vancomycin (VANCOCIN) 2,000 mg in sodium chloride 0.9 % 500 mL IVPB        2,000 mg 250 mL/hr over 120 Minutes Intravenous Every 24 hours 10/02/11 0338     09/24/11 0000   vancomycin (VANCOCIN) 2,000 mg in sodium chloride 0.9 % 500 mL IVPB  Status:  Discontinued        2,000 mg 250 mL/hr over 120 Minutes Intravenous Every 48 hours 09/22/11 0039 10/02/11 0338   09/22/11 0115   vancomycin (VANCOCIN) 2,500 mg in sodium chloride 0.9 % 500 mL IVPB        2,500 mg 250 mL/hr over 120 Minutes Intravenous  Once 09/22/11 0039 09/22/11 0338          Assessment: Patient with low vancomycin level.    Goal of Therapy:  Vancomycin trough level 15-20 mcg/ml  Plan:  Measure antibiotic drug levels at steady state Follow up culture results Will change to 2gm iv q24hr  [name], [name] 10/02/2011,3:39 AM

## 2011-10-02 NOTE — Progress Notes (Signed)
9 Days Post-Op  Subjective: Doing well with regards to peri-rectal wound.  Reports little pain in the right buttock area.  Objective: Vital signs in last 24 hours: Temp:  [97.3 F (36.3 C)-98 F (36.7 C)] 98 F (36.7 C) (04/22 0528) Pulse Rate:  [59-82] 59  (04/22 0528) Resp:  [18-20] 18  (04/22 0528) BP: (137-151)/(60-77) 151/67 mmHg (04/22 0528) SpO2:  [96 %-98 %] 98 % (04/22 0528) FiO2 (%):  [21 %] 21 % (04/21 1020) Weight:  [151.2 kg (333 lb 5.4 oz)] 151.2 kg (333 lb 5.4 oz) (04/22 0528) Last BM Date: 10/01/11  Intake/Output from previous day: 04/21 0701 - 04/22 0700 In: 480 [P.O.:480] Out: 625 [Urine:625] Intake/Output this shift:    Right buttock peri-rectal wound is clean, with pink granulation 95%, yellow slough 5%, no odor, no drainage, except minimal serous  Lab Results:   [facility] 10/02/11 0515 10/01/11 0441  WBC 9.8 9.3  HGB 8.6* 8.3*  HCT 25.7* 24.7*  PLT 190 203   BMET  Basename 10/02/11 0515 10/01/11 0441  NA 134* 135  K 4.6 4.7  CL 103 104  CO2 19 18*  GLUCOSE 116* 135*  BUN 67* 62*  CREATININE 3.69* 3.60*  CALCIUM 8.9 8.6   PT/INR No results found for this basename: LABPROT:2,INR:2 in the last 72 hours ABG No results found for this basename: PHART:2,PCO2:2,PO2:2,HCO3:2 in the last 72 hours  Studies/Results: No results found.  Anti-infectives: Anti-infectives     Start     Dose/Rate Route Frequency Ordered Stop   10/03/11 0000   vancomycin (VANCOCIN) 2,000 mg in sodium chloride 0.9 % 500 mL IVPB        2,000 mg 250 mL/hr over 120 Minutes Intravenous Every 24 hours 10/02/11 0338     09/24/11 0000   vancomycin (VANCOCIN) 2,000 mg in sodium chloride 0.9 % 500 mL IVPB  Status:  Discontinued        2,000 mg 250 mL/hr over 120 Minutes Intravenous Every 48 hours 09/22/11 0039 10/02/11 0338   09/22/11 0115   vancomycin (VANCOCIN) 2,500 mg in sodium chloride 0.9 % 500 mL IVPB        2,500 mg 250 mL/hr over 120 Minutes Intravenous  Once  09/22/11 0039 09/22/11 0338          Assessment/Plan: s/p Procedure(s) (LRB): INCISION AND DRAINAGE ABSCESS (Right) Buttock- Healing well and without signs of infection. Continue wound care TID while hospitalized and will need wet to dry dressing changes BID.   He can shower and then have dressing change, from our standpoint. He will need home health care to help with dressing changes. He may need to have someone help him with BID dressing changes. I doubt HH will come out more than once a day.  Follow up information already entered into system for appointment with Dr. Abbey Chatters in 2 weeks.  Pt is stable from surgery standpoint.    LOS: 11 days    Austin Isa,PA-C Pager [phone] General Trauma Pager [phone]

## 2011-10-02 NOTE — Progress Notes (Signed)
   CARE MANAGEMENT NOTE 10/02/2011  Patient:  [name], [name]   Account Number:  [id number]  Date Initiated:  09/23/2011  Documentation initiated by:  DAVIS,TYMEEKA  Subjective/Objective Assessment:   76 yo male admitted with renal failure, S/P right buttock abscess I&D. PCP Dr.Dean.     Action/Plan:   Home vs Rehab   Anticipated DC Date:  10/03/2011   Anticipated DC Plan:  HOME W HOME HEALTH SERVICES  In-house referral  NA      DC Planning Services  CM consult      Choice offered to / List presented to:  C-1 Patient      DME agency  [facility]     [facility] arranged  HH-1 RN      [facility] agency  [facility]   Status of service:  In process, will continue to follow Medicare Important Message given?   (If response is "NO", the following Medicare IM given date fields will be blank) Date Medicare IM given:   Date Additional Medicare IM given:    Discharge Disposition:    Per UR Regulation:  Reviewed for med. necessity/level of care/duration of stay  If discussed at Long Length of Stay Meetings, dates discussed:    Comments:  10/02/11 Dekisha Mesmer RN,BSN NCM [phone] [facility] FOLLOWING.HHRN-DSG CHANGES,NEED F2F.ORDERS FOR HHRN WOUND CARE PROTOCAL.  09/29/11 Ayron Fillinger RN,BSN NCM [phone] [facility] CHOSEN, & INFORMED FOR HHRN-DSG CHANGES.WILL NEED HHRN ORDERS, & F2F.NOTED NEPHROLOGY NOTE.  09/26/11 Keishaun Hazel RN,BSN NCM [phone] PATIENT ASKING ABOUT INPT REHAB.DR. August Saucer AWARE,& PLANS TO ORDER PT EVAL.[facility] WAS USED IN THE PAST, & [facility] DME FOR HOME 02.PATIENT HAS ALL DME EXCEPT A 3N1.  09/23/11 1347 [name] [phone] Cm spoke with pt with adukt daughter who is an Rn at bedside. CM consulted concerning medication assistance, per daughter pt unable to afford current medication regimen. Pt has Medicare A&B, and AARP. Pt states med copays too expensive. Pt currently taking 20 home meds, only 4 on generic list. CM provided pt's daughter with [facility] generic  drug list, [email or web address] applications for seveal brand name meds. pt and daughter encouraged to ask MD to switch meds to generic. Pt and daughter advised to have MD assist with needymeds applications prior to dc. Pt eligible for indigent funds. Pt eligible for outpt heart failure medication assistance. Pt daughter request HHRN upon d/c. Pt and daughter given choice list for [facility].

## 2011-10-02 NOTE — Progress Notes (Signed)
Physical Therapy Treatment Patient Details Name: [name] MRN: [id number] DOB: July 10, 1935 Today's Date: 10/02/2011 Time: [id number] PT Time Calculation (min): 10 min 1gt  PT Assessment / Plan / Recommendation Comments on Treatment Session  O2 sats on RA 99% before, during and after ambulation.  Pt. ambulated 60 ft (30 X2) min guard assist with single tip cane. Pt was DOE 2/4. VC needed for UE when sit-->stand/stand-->sit                                Pertinent Vitals/Pain O2 sats 99% on RA    Mobility  Transfers Transfers: Sit to Stand Sit to Stand: 4: Min guard;From chair/3-in-1 (vc to scoot to edge of chair and push off with hand) Stand to Sit: 4: Min guard;To chair/3-in-1 Ambulation/Gait Ambulation/Gait Assistance: 4: Min guard Ambulation Distance (Feet): 60 Feet (30 X 2 seated rest break DOE 2/4) Assistive device: Straight cane Gait Pattern: Step-through pattern;Decreased stride length Gait velocity: decreased Stairs: No Wheelchair Mobility Wheelchair Mobility: No         PT Goals Acute Rehab PT Goals PT Goal Formulation: With patient Pt will go Sit to Stand: with modified independence PT Goal: Sit to Stand - Progress: Progressing toward goal Pt will Ambulate: 16 - 50 feet;with modified independence;with least restrictive assistive device PT Goal: Ambulate - Progress: Progressing toward goal  Visit Information  Last PT Received On: 10/02/11 Assistance Needed: +1    Subjective Data  Subjective: Feeling better today, o2 sats on RA 99% sitting, during and after ambulation   Cognition       Balance     End of Session PT - End of Session Equipment Utilized During Treatment: Gait belt Activity Tolerance: Patient limited by fatigue Patient left: in chair    Hiram Comber, SPTA 10/02/2011, 10:37 AM  Felecia Shelling  PTA [facility]  Acute  Rehab Pager     [phone]

## 2011-10-02 NOTE — Progress Notes (Signed)
Agree Wilmon Arms. Corliss Skains, MD, [facility] Surgery  10/02/2011 2:06 PM

## 2011-10-02 NOTE — Progress Notes (Signed)
Patient ID: [name], male   DOB: 13-Jun-1935, 76 y.o.   MRN: [id number]   General Surgery Discharge recommendations :  [facility] RN for BID dressing changes per wound care protocol,  Patient to follow up with Dr. Abbey Chatters in 2 weeks.   Thank you,   Caroleen Stoermer,PA-C Pager [phone] General Trauma Pager [phone]

## 2011-10-02 NOTE — Progress Notes (Signed)
Subjective:  Patient reports he's doing better overall. He has only mild buttock discomfort. Patient has been more   ambulatory. Denies any worsening exertional dyspnea. Preparations presently being made for patient to be discharged home.   Allergies  Allergen Reactions  . Penicillins     Pt denies  Allergy to me this may be an error  . Sulfa Antibiotics     Information from Childhood.  He didn't know reaction   Current Facility-Administered Medications  Medication Dose Route Frequency Provider Last Rate Last Dose  . 0.9 %  sodium chloride infusion   Intravenous Continuous Gwenyth Bender, MD 20 mL/hr at 09/26/11 1339 20 mL/hr at 09/26/11 1339  . albuterol (PROVENTIL HFA;VENTOLIN HFA) 108 (90 BASE) MCG/ACT inhaler 2 puff  2 puff Inhalation Q6H PRN Dorothea Ogle, MD      . albuterol (PROVENTIL) (5 MG/ML) 0.5% nebulizer solution 2.5 mg  2.5 mg Nebulization Q2H PRN Dorothea Ogle, MD   2.5 mg at 09/21/11 2238  . aspirin EC tablet 81 mg  81 mg Oral Daily Dorothea Ogle, MD   81 mg at 10/02/11 0931  . budesonide-formoterol (SYMBICORT) 160-4.5 MCG/ACT inhaler 2 puff  2 puff Inhalation BID Dorothea Ogle, MD   2 puff at 10/01/11 2027  . calcium carbonate (TUMS - dosed in mg elemental calcium) chewable tablet 200 mg of elemental calcium  1 tablet Oral TID WC Cecille Aver, MD   200 mg of elemental calcium at 10/02/11 1713  . carvedilol (COREG) tablet 25 mg  25 mg Oral BID WC Dorothea Ogle, MD   25 mg at 10/02/11 1713  . darbepoetin (ARANESP) injection 100 mcg  100 mcg Subcutaneous Q Thu-1800 Cecille Aver, MD   100 mcg at 09/28/11 1718  . furosemide (LASIX) tablet 40 mg  40 mg Oral BID Gwenyth Bender, MD   40 mg at 10/02/11 1713  . HYDROcodone-acetaminophen (NORCO) 5-325 MG per tablet 1-2 tablet  1-2 tablet Oral Q4H PRN Dorothea Ogle, MD   2 tablet at 10/02/11 [phone]  . insulin aspart (novoLOG) injection 0-15 Units  0-15 Units Subcutaneous TID WC Gwenyth Bender, MD   3 Units at 10/02/11 1713  .  insulin aspart (novoLOG) injection 0-5 Units  0-5 Units Subcutaneous QHS Gwenyth Bender, MD   3 Units at 10/01/11 2213  . insulin glargine (LANTUS) injection 20 Units  20 Units Subcutaneous BID Dorothea Ogle, MD   20 Units at 10/02/11 2158  . levothyroxine (SYNTHROID, LEVOTHROID) tablet 50 mcg  50 mcg Oral QAC breakfast Dorothea Ogle, MD   50 mcg at 10/02/11 0751  . linagliptin (TRADJENTA) tablet 5 mg  5 mg Oral Daily Dorothea Ogle, MD   5 mg at 10/02/11 0931  . morphine injection 2-4 mg  2-4 mg Intravenous Q2H PRN Adolph Pollack, MD      . simvastatin (ZOCOR) tablet 10 mg  10 mg Oral [id number] Dorothea Ogle, MD   10 mg at 10/02/11 1713  . sodium chloride 0.9 % injection 3 mL  3 mL Intravenous Q12H Dorothea Ogle, MD   3 mL at 10/02/11 2158  . Tamsulosin HCl (FLOMAX) capsule 0.4 mg  0.4 mg Oral Daily Dorothea Ogle, MD   0.4 mg at 10/02/11 0931  . DISCONTD: vancomycin (VANCOCIN) 2,000 mg in sodium chloride 0.9 % 500 mL IVPB  2,000 mg Intravenous Q48H Lorenza Evangelist, PHARMD   2,000 mg at 10/02/11  9  . DISCONTD: vancomycin (VANCOCIN) 2,000 mg in sodium chloride 0.9 % 500 mL IVPB  2,000 mg Intravenous Q24H Gwenyth Bender, MD        Objective: Blood pressure 170/83, pulse 65, temperature 97.7 F (36.5 C), temperature source Oral, resp. rate 18, height 5\' 10"  (1.778 m), weight 333 lb 5.4 oz (151.2 kg), SpO2 100.00%.  Well-developed overweight white male in no acute distress. HEENT:no sinus tenderness. NECK:no posterior cervical nodes. LUNGS:distant breath sounds without wheezes. No vocal fremitus. ZO:XWRUEA S1, S2 without S3. VWU:JWJXB. Early ventral hernia noted. JYN:WGNFA edema. Negative Homans. NEURO:intact.  Lab results: Results for orders placed during the hospital encounter of 09/21/11 (from the past 48 hour(s))  COMPREHENSIVE METABOLIC PANEL     Status: Abnormal   Collection Time   10/01/11  4:41 AM      Component Value Range Comment   Sodium 135  135 - 145 (mEq/L)    Potassium 4.7  3.5  - 5.1 (mEq/L)    Chloride 104  96 - 112 (mEq/L)    CO2 18 (*) 19 - 32 (mEq/L)    Glucose, Bld 135 (*) 70 - 99 (mg/dL)    BUN 62 (*) 6 - 23 (mg/dL)    Creatinine, Ser 2.13 (*) 0.50 - 1.35 (mg/dL)    Calcium 8.6  8.4 - 10.5 (mg/dL)    Total Protein 7.0  6.0 - 8.3 (g/dL)    Albumin 2.7 (*) 3.5 - 5.2 (g/dL)    AST 6  0 - 37 (U/L)    ALT 11  0 - 53 (U/L)    Alkaline Phosphatase 114  39 - 117 (U/L)    Total Bilirubin 0.2 (*) 0.3 - 1.2 (mg/dL)    GFR calc non Af Amer 15 (*) >90 (mL/min)    GFR calc Af Amer 18 (*) >90 (mL/min)   PRO B NATRIURETIC PEPTIDE     Status: Abnormal   Collection Time   10/01/11  4:41 AM      Component Value Range Comment   Pro B Natriuretic peptide (BNP) 955.6 (*) 0 - 125 (pg/mL)   CBC     Status: Abnormal   Collection Time   10/01/11  4:41 AM      Component Value Range Comment   WBC 9.3  4.0 - 10.5 (K/uL)    RBC 2.80 (*) 4.22 - 5.81 (MIL/uL)    Hemoglobin 8.3 (*) 13.0 - 17.0 (g/dL)    HCT 08.6 (*) 57.8 - 52.0 (%)    MCV 88.2  78.0 - 100.0 (fL)    MCH 29.6  26.0 - 34.0 (pg)    MCHC 33.6  30.0 - 36.0 (g/dL)    RDW 46.9  62.9 - 52.8 (%)    Platelets 203  150 - 400 (K/uL)   DIFFERENTIAL     Status: Normal   Collection Time   10/01/11  4:41 AM      Component Value Range Comment   Neutrophils Relative 73  43 - 77 (%)    Lymphocytes Relative 13  12 - 46 (%)    Monocytes Relative 10  3 - 12 (%)    Eosinophils Relative 4  0 - 5 (%)    Basophils Relative 0  0 - 1 (%)    Neutro Abs 6.8  1.7 - 7.7 (K/uL)    Lymphs Abs 1.2  0.7 - 4.0 (K/uL)    Monocytes Absolute 0.9  0.1 - 1.0 (K/uL)    Eosinophils Absolute 0.4  0.0 - 0.7 (K/uL)    Basophils Absolute 0.0  0.0 - 0.1 (K/uL)    WBC Morphology INCREASED BANDS (>20% BANDS)     GLUCOSE, CAPILLARY     Status: Abnormal   Collection Time   10/01/11  7:37 AM      Component Value Range Comment   Glucose-Capillary 111 (*) 70 - 99 (mg/dL)   GLUCOSE, CAPILLARY     Status: Abnormal   Collection Time   10/01/11 11:45 AM       Component Value Range Comment   Glucose-Capillary 130 (*) 70 - 99 (mg/dL)   GLUCOSE, CAPILLARY     Status: Abnormal   Collection Time   10/01/11  4:55 PM      Component Value Range Comment   Glucose-Capillary 121 (*) 70 - 99 (mg/dL)   GLUCOSE, CAPILLARY     Status: Abnormal   Collection Time   10/01/11  9:13 PM      Component Value Range Comment   Glucose-Capillary 162 (*) 70 - 99 (mg/dL)    Comment 1 Notify RN      Comment 2 Documented in Chart     VANCOMYCIN, TROUGH     Status: Abnormal   Collection Time   10/01/11 11:19 PM      Component Value Range Comment   Vancomycin Tr <5.0 (*) 10.0 - 20.0 (ug/mL)   COMPREHENSIVE METABOLIC PANEL     Status: Abnormal   Collection Time   10/02/11  5:15 AM      Component Value Range Comment   Sodium 134 (*) 135 - 145 (mEq/L)    Potassium 4.6  3.5 - 5.1 (mEq/L)    Chloride 103  96 - 112 (mEq/L)    CO2 19  19 - 32 (mEq/L)    Glucose, Bld 116 (*) 70 - 99 (mg/dL)    BUN 67 (*) 6 - 23 (mg/dL)    Creatinine, Ser 7.82 (*) 0.50 - 1.35 (mg/dL)    Calcium 8.9  8.4 - 10.5 (mg/dL)    Total Protein 7.5  6.0 - 8.3 (g/dL)    Albumin 2.9 (*) 3.5 - 5.2 (g/dL)    AST 6  0 - 37 (U/L)    ALT 13  0 - 53 (U/L)    Alkaline Phosphatase 118 (*) 39 - 117 (U/L)    Total Bilirubin 0.3  0.3 - 1.2 (mg/dL)    GFR calc non Af Amer 15 (*) >90 (mL/min)    GFR calc Af Amer 17 (*) >90 (mL/min)   CBC     Status: Abnormal   Collection Time   10/02/11  5:15 AM      Component Value Range Comment   WBC 9.8  4.0 - 10.5 (K/uL)    RBC 2.93 (*) 4.22 - 5.81 (MIL/uL)    Hemoglobin 8.6 (*) 13.0 - 17.0 (g/dL)    HCT 95.6 (*) 21.3 - 52.0 (%)    MCV 87.7  78.0 - 100.0 (fL)    MCH 29.4  26.0 - 34.0 (pg)    MCHC 33.5  30.0 - 36.0 (g/dL)    RDW 08.6  57.8 - 46.9 (%)    Platelets 190  150 - 400 (K/uL)   DIFFERENTIAL     Status: Normal   Collection Time   10/02/11  5:15 AM      Component Value Range Comment   Neutrophils Relative 72  43 - 77 (%)    Neutro Abs 7.1  1.7 - 7.7 (K/uL)  Lymphocytes Relative 16  12 - 46 (%)    Lymphs Abs 1.5  0.7 - 4.0 (K/uL)    Monocytes Relative 8  3 - 12 (%)    Monocytes Absolute 0.8  0.1 - 1.0 (K/uL)    Eosinophils Relative 4  0 - 5 (%)    Eosinophils Absolute 0.4  0.0 - 0.7 (K/uL)    Basophils Relative 0  0 - 1 (%)    Basophils Absolute 0.0  0.0 - 0.1 (K/uL)   PRO B NATRIURETIC PEPTIDE     Status: Abnormal   Collection Time   10/02/11  5:17 AM      Component Value Range Comment   Pro B Natriuretic peptide (BNP) 787.2 (*) 0 - 125 (pg/mL)   GLUCOSE, CAPILLARY     Status: Abnormal   Collection Time   10/02/11  8:00 AM      Component Value Range Comment   Glucose-Capillary 102 (*) 70 - 99 (mg/dL)   GLUCOSE, CAPILLARY     Status: Abnormal   Collection Time   10/02/11 12:06 PM      Component Value Range Comment   Glucose-Capillary 160 (*) 70 - 99 (mg/dL)   GLUCOSE, CAPILLARY     Status: Abnormal   Collection Time   10/02/11  5:06 PM      Component Value Range Comment   Glucose-Capillary 171 (*) 70 - 99 (mg/dL)    Comment 1 Notify RN      Comment 2 Documented in Chart     GLUCOSE, CAPILLARY     Status: Abnormal   Collection Time   10/02/11  9:52 PM      Component Value Range Comment   Glucose-Capillary 139 (*) 70 - 99 (mg/dL)    Comment 1 Documented in Chart      Comment 2 Notify RN       Studies/Results: No results found.  Patient Active Problem List  Diagnoses  . Diabetes mellitus  . COPD with respiratory distress, acute  . CHF (congestive heart failure), NYHA class III  . Abscess of cellulitis of buttock  . Renal failure (ARF), acute on chronic    Impression: Resolving buttock abscess secondary to staph aureus. Diabetes mellitus rash improving. Chronic renal failure. Anemia of chronic disease. We'll plan to keep his hemoglobin closer to 10. Congestive heart failure improving. COPD with respiratory distress improved. Coronary artery disease history. History of medication noncompliance. Depression  history.    Plan: Coordinate home health services for followup of medication compliance. Coordinate wound care and dressing changes. Repeat CBC in a.m. Transfuse one unit of packed RBCs if hemoglobin less than 9. Outpatient management of erythropoietin. Reinforce diabetic diet and medication.   August Saucer, Lani Havlik 10/02/2011 10:29 PM

## 2011-10-03 LAB — CBC
Hemoglobin: 8.7 g/dL — ABNORMAL LOW (ref 13.0–17.0)
MCH: 30.5 pg (ref 26.0–34.0)
MCV: 88.4 fL (ref 78.0–100.0)
Platelets: 197 10*3/uL (ref 150–400)
RBC: 2.85 MIL/uL — ABNORMAL LOW (ref 4.22–5.81)
WBC: 9.7 10*3/uL (ref 4.0–10.5)

## 2011-10-03 LAB — ANCA SCREEN W REFLEX TITER
c-ANCA Screen: NEGATIVE
p-ANCA Screen: NEGATIVE

## 2011-10-03 LAB — MULTIPLE MYELOMA PANEL, SERUM
Albumin ELP: 42.6 % — ABNORMAL LOW (ref 55.8–66.1)
Alpha-1-Globulin: 6.8 % — ABNORMAL HIGH (ref 2.9–4.9)
IgG (Immunoglobin G), Serum: 1670 mg/dL — ABNORMAL HIGH (ref 650–1600)
IgM, Serum: 44 mg/dL — ABNORMAL LOW (ref 41–251)

## 2011-10-03 LAB — TYPE AND SCREEN: DAT, IgG: NEGATIVE

## 2011-10-03 LAB — COMPREHENSIVE METABOLIC PANEL
ALT: 13 U/L (ref 0–53)
AST: 7 U/L (ref 0–37)
Calcium: 8.9 mg/dL (ref 8.4–10.5)
Creatinine, Ser: 3.71 mg/dL — ABNORMAL HIGH (ref 0.50–1.35)
Sodium: 135 mEq/L (ref 135–145)
Total Protein: 7.2 g/dL (ref 6.0–8.3)

## 2011-10-03 LAB — DIFFERENTIAL
Eosinophils Absolute: 0.4 10*3/uL (ref 0.0–0.7)
Eosinophils Relative: 4 % (ref 0–5)
Lymphocytes Relative: 14 % (ref 12–46)
Lymphs Abs: 1.4 10*3/uL (ref 0.7–4.0)
Monocytes Relative: 8 % (ref 3–12)
Neutrophils Relative %: 73 % (ref 43–77)

## 2011-10-03 LAB — GLUCOSE, CAPILLARY: Glucose-Capillary: 79 mg/dL (ref 70–99)

## 2011-10-03 MED ORDER — ALBUTEROL SULFATE (5 MG/ML) 0.5% IN NEBU: 2.5000 mg | INHALATION_SOLUTION | RESPIRATORY_TRACT | Status: AC | PRN

## 2011-10-03 MED ORDER — INSULIN ASPART 100 UNIT/ML ~~LOC~~ SOLN: 0.0000 [IU] | Freq: Three times a day (TID) | SUBCUTANEOUS | Status: DC

## 2011-10-03 MED ORDER — FUROSEMIDE 40 MG PO TABS: 40.0000 mg | ORAL_TABLET | Freq: Two times a day (BID) | ORAL | Status: DC

## 2011-10-03 MED ORDER — LINAGLIPTIN 5 MG PO TABS: 5.0000 mg | ORAL_TABLET | Freq: Every day | ORAL | Status: AC

## 2011-10-03 MED ORDER — DARBEPOETIN ALFA-POLYSORBATE 100 MCG/0.5ML IJ SOLN: 100.0000 ug | INTRAMUSCULAR | Status: DC

## 2011-10-03 NOTE — Progress Notes (Signed)
Subjective: Feeling well.   Objective Vital signs in last 24 hours: Filed Vitals:   10/02/11 1506 10/02/11 2156 10/03/11 0527 10/03/11 0745  BP: 159/94 170/83 166/64   Pulse: 58 65 66   Temp: 97.4 F (36.3 C) 97.7 F (36.5 C) 97.1 F (36.2 C)   TempSrc: Oral Oral Oral   Resp: 18 18 18    Height:      Weight:   148.644 kg (327 lb 11.2 oz)   SpO2: 100% 100% 99% 98%   Weight change: -0.256 kg (-9 oz)  Intake/Output Summary (Last 24 hours) at 10/03/11 1146 Last data filed at 10/03/11 0900  Gross per 24 hour  Intake    720 ml  Output   2100 ml  Net  -1380 ml   Labs: Basic Metabolic Panel:  Lab 10/03/11 5784 10/02/11 0515 10/01/11 0441 09/30/11 0452 09/29/11 0445 09/28/11 0450 09/27/11 0440  NA 135 134* 135 133* 135 135 137  K 4.4 4.6 4.7 4.8 4.8 5.2* 4.9  CL 104 103 104 103 105 108 108  CO2 20 19 18* 19 19 18* 18*  GLUCOSE 115* 116* 135* 135* 114* 98 116*  BUN 67* 67* 62* 58* 59* 54* 49*  CREATININE 3.71* 3.69* 3.60* 3.49* 3.55* 3.72* 3.40*  ALB -- -- -- -- -- -- --  CALCIUM 8.9 8.9 8.6 8.7 8.9 8.8 8.6  PHOS -- -- -- -- 5.4* -- --   Liver Function Tests:  Lab 10/03/11 0516 10/02/11 0515 10/01/11 0441  AST 7 6 6   ALT 13 13 11   ALKPHOS 116 118* 114  BILITOT 0.3 0.3 0.2*  PROT 7.2 7.5 7.0  ALBUMIN 2.9* 2.9* 2.7*   No results found for this basename: LIPASE:3,AMYLASE:3 in the last 168 hours No results found for this basename: AMMONIA:3 in the last 168 hours CBC:  Lab 10/03/11 0516 10/02/11 0515 10/01/11 0441 09/30/11 0452  WBC 9.7 9.8 9.3 9.0  NEUTROABS 7.1 7.1 6.8 6.2  HGB 8.7* 8.6* 8.3* 8.8*  HCT 25.2* 25.7* 24.7* 25.7*  MCV 88.4 87.7 88.2 86.8  PLT 197 190 203 221   PT/INR: @labrcntip (inr:5) Cardiac Enzymes: No results found for this basename: CKTOTAL:5,CKMB:5,CKMBINDEX:5,TROPONINI:5 in the last 168 hours CBG:  Lab 10/03/11 0759 10/02/11 2152 10/02/11 1706 10/02/11 1206 10/02/11 0800  GLUCAP 79 139* 171* 160* 102*    Iron Studies:  Lab 09/29/11 0445   IRON 46  TIBC 262  TRANSFERRIN --  FERRITIN 212   Studies/Results: No results found. Medications:    . sodium chloride 20 mL/hr (09/26/11 1339)      . aspirin EC  81 mg Oral Daily  . budesonide-formoterol  2 puff Inhalation BID  . calcium carbonate  1 tablet Oral TID WC  . carvedilol  25 mg Oral BID WC  . darbepoetin (ARANESP) injection - NON-DIALYSIS  100 mcg Subcutaneous Q Thu-1800  . furosemide  40 mg Oral BID  . insulin aspart  0-15 Units Subcutaneous TID WC  . insulin aspart  0-5 Units Subcutaneous QHS  . insulin glargine  20 Units Subcutaneous BID  . levothyroxine  50 mcg Oral QAC breakfast  . linagliptin  5 mg Oral Daily  . simvastatin  10 mg Oral q1800  . sodium chloride  3 mL Intravenous Q12H  . Tamsulosin HCl  0.4 mg Oral Daily  . DISCONTD: vancomycin  2,000 mg Intravenous Q24H    I  have reviewed scheduled and prn medications.  Physical Exam:  Blood pressure 166/64, pulse 66, temperature 97.1  F (36.2 C), temperature source Oral, resp. rate 18, height 5\' 10"  (1.778 m), weight 148.644 kg (327 lb 11.2 oz), SpO2 98.00%.  General: alert, pleasant, NAD  Heart: RRR  Lungs: mostly clear  Abdomen:soft, non tender  Extremities: minimal edema, improved per the patient  I  Assessment/ Plan:  Pt is a 76 y.o. yo male who was admitted on 09/21/2011 with buttock abscess but found to have elevated creatinine (baseline unknown)  Assessment/Plan:  1. Elevated creatinine- Suspect diabetic nephropathy and that this is a chronic issue. Kidney function is poor but stable. There are no indications for HD at this time. F/U appt with Dr. Kathrene Bongo has been scheduled for May 21st at 9:15 am. Will sign off, patient was given information for this appt.  2. Anemia- anemic, have given dose of aranesp and iron stores are pending.  3. Abscess- cultures grew MSSA- per primary team, may be able to simplify antibiotic to something he can take as OP  4. Secondary hyperparathyroidism- phos a  little high and calcium low, will start TUMS with meals and check an intact PTH  5. HTN/volume- on coreg/lasix. Hesitant to start ACE or ARB, would add norvasc if more control continues to be needed.  Vinson Moselle  MD [facility] [phone] pgr    [phone] cell 10/03/2011, 11:46 AM

## 2011-10-03 NOTE — Progress Notes (Signed)
   CARE MANAGEMENT NOTE 10/03/2011  Patient:  [name], [name]   Account Number:  [id number]  Date Initiated:  09/23/2011  Documentation initiated by:  DAVIS,TYMEEKA  Subjective/Objective Assessment:   76 yo male admitted with renal failure, S/P right buttock abscess I&D. PCP Dr.Dean.     Action/Plan:   Home vs Rehab   Anticipated DC Date:  10/03/2011   Anticipated DC Plan:  HOME W HOME HEALTH SERVICES  In-house referral  NA      DC Planning Services  CM consult      Choice offered to / List presented to:  C-1 Patient      DME agency  [facility]     [facility] arranged  HH-1 RN      [facility] agency  [facility]   Status of service:  Completed, signed off Medicare Important Message given?   (If response is "NO", the following Medicare IM given date fields will be blank) Date Medicare IM given:   Date Additional Medicare IM given:    Discharge Disposition:  HOME W HOME HEALTH SERVICES  Per UR Regulation:  Reviewed for med. necessity/level of care/duration of stay  If discussed at Long Length of Stay Meetings, dates discussed:    Comments:  10/03/11 Shade Kaley RN,BSN NCM [phone] [facility] FOR HHRN, PATIENT HAS FAMILY FOR TRANSP.  10/02/11 Luzelena Heeg RN,BSN NCM [phone] [facility] FOLLOWING.HHRN-DSG CHANGES,NEED F2F.ORDERS FOR HHRN WOUND CARE PROTOCAL.  09/29/11 Mistina Coatney RN,BSN NCM [phone] [facility] CHOSEN, & INFORMED FOR HHRN-DSG CHANGES.WILL NEED HHRN ORDERS, & F2F.NOTED NEPHROLOGY NOTE.  09/26/11 Fain Francis RN,BSN NCM [phone] PATIENT ASKING ABOUT INPT REHAB.DR. August Saucer AWARE,& PLANS TO ORDER PT EVAL.[facility] WAS USED IN THE PAST, & [facility] DME FOR HOME 02.PATIENT HAS ALL DME EXCEPT A 3N1.  09/23/11 1347 [name] [phone] Cm spoke with pt with adukt daughter who is an Rn at bedside. CM consulted concerning medication assistance, per daughter pt unable to afford current medication regimen. Pt has Medicare A&B, and AARP. Pt states med copays too expensive. Pt  currently taking 20 home meds, only 4 on generic list. CM provided pt's daughter with [facility] generic drug list, needymed.com applications for seveal brand name meds. pt and daughter encouraged to ask MD to switch meds to generic. Pt and daughter advised to have MD assist with needymeds applications prior to dc. Pt eligible for indigent funds. Pt eligible for outpt heart failure medication assistance. Pt daughter request HHRN upon d/c. Pt and daughter given choice list for [facility].

## 2011-10-05 ENCOUNTER — Telehealth (INDEPENDENT_AMBULATORY_CARE_PROVIDER_SITE_OTHER): Payer: Self-pay

## 2011-10-05 NOTE — Telephone Encounter (Signed)
LMOV for Toni Amend that we would have an answer for her tomorrow and would call her and the patient.

## 2011-10-05 NOTE — Telephone Encounter (Signed)
Courtney w/[facility] calling to check with Dr Abbey Chatters about the pt maybe getting a wound vac instead of BID dressing changes with wet to dry. The pt lives by himself with no one to teach the dressing changes to. If the wound vac is not an option what about switching from BID to daily dressing changes with alginate. Please advise.

## 2011-10-06 ENCOUNTER — Telehealth (INDEPENDENT_AMBULATORY_CARE_PROVIDER_SITE_OTHER): Payer: Self-pay

## 2011-10-06 NOTE — Telephone Encounter (Signed)
Spoke with Toni Amend at [facility].  Gave instructions for qd wet to dry dressing changes without alginate per Dr. Abbey Chatters.

## 2011-10-12 ENCOUNTER — Telehealth (INDEPENDENT_AMBULATORY_CARE_PROVIDER_SITE_OTHER): Payer: Self-pay

## 2011-10-12 NOTE — Telephone Encounter (Signed)
Toni Amend, [facility] nurse called requesting a follow up appointment in the next week or two with Dr. Abbey Chatters.  Toni Amend reports patient has a non-healing right buttock wound that she would like Dr. Abbey Chatters to evaluate and treat.

## 2011-10-12 NOTE — Telephone Encounter (Signed)
LMOV pt has post op appt on 10/17/11 with Dr. Abbey Chatters.

## 2011-10-17 ENCOUNTER — Ambulatory Visit (INDEPENDENT_AMBULATORY_CARE_PROVIDER_SITE_OTHER): Payer: Medicare Other | Admitting: General Surgery

## 2011-10-17 ENCOUNTER — Encounter (INDEPENDENT_AMBULATORY_CARE_PROVIDER_SITE_OTHER): Payer: Self-pay | Admitting: General Surgery

## 2011-10-17 VITALS — BP 136/81 | HR 76 | Temp 97.9°F | Ht 70.0 in | Wt 340.2 lb

## 2011-10-17 DIAGNOSIS — Z9889 Other specified postprocedural states: Secondary | ICD-10-CM

## 2011-10-17 NOTE — Progress Notes (Signed)
Operation: Incision, drainage, and debridement of right buttock abscess  Date: September 23, 2011  Pathology: Findings consistent with abscess  HPI: He is here for his first postoperative visit after being discharged from the hospital. Dressing changes are normal saline damp to dry daily.   Physical Exam: Right buttock wound is clean and healing in well by secondary intention.  Assessment: Status post incision, drainage, debridement of right buttock abscess-wound healing in well by secondary intention.  Plan: Continue current dressing changes. Return visit one month.

## 2011-10-17 NOTE — Patient Instructions (Signed)
Continue same dressing changes

## 2011-10-31 ENCOUNTER — Telehealth (INDEPENDENT_AMBULATORY_CARE_PROVIDER_SITE_OTHER): Payer: Self-pay

## 2011-10-31 NOTE — Telephone Encounter (Signed)
Courtney from [facility] called to request order changes from qd wet to dry, to 3xwk hydrogel.  I informed her that Dr. Abbey Chatters was not an advocate of the hydrogel, and she needed to continue wet to dry dressings until the patient's appointment on 11/15/11.  She did report the wound was 2cm x 5.5cm x 1cm in depth.

## 2011-11-01 NOTE — Discharge Summary (Signed)
Physician Discharge Summary  Patient ID: [name] MRN: [id number] DOB/AGE: 1936/03/16 76 y.o.  Admit date: 09/21/2011 Discharge date: 4 /23/2013   Discharge Diagnoses:   Right buttock abscess secondary to methicillin sensitive staph aureus. Uncontrolled diabetes mellitus. Acute respiratory distress secondary to COPD and congestive heart failure. Acute on chronic diastolic congestive heart failure Acute on chronic renal failure. Anemia of chronic disease. Chronic medical noncompliance. Obesity. Depression. Discharged Condition: Improved.  Operations/Procedues: Incision and drainage of right buttock abscess x2 per Dr. Abbey Chatters.   Hospital Course: See admission H&P for details. Patient was admitted to the hospital for further treatment of increasing dyspnea on exertion, uncontrolled diabetes. He was noted to have evidence for early congestive heart failure with a maximum pro BNP of 5642. He was started on IV Lasix is 40 mg IV twice a day and was placed on bronchodilators as well. He was noted also to have a right buttock abscess. Patient was started empirically on vancomycin IV per pharmacy protocol. Patient was started on a sliding scale insulin regimen for his blood sugars. Patient was seen in consultation by Dr. Abbey Chatters of surgery. He subsequently underwent incision and drainage of 2 right buttock abscesses. This subsequently was found to be positive for methicillin sensitive staph aureus. Patient was continued on antibiotics. He underwent wound care thereafter with gradual healing. He was maintained on his bronchodilators and IV Lasix for his acute on chronic congestive heart failure. She was eventually switched over to oral Lasix at 40 mg twice a day.   He was also noted to have renal insufficiency with a creatinine of 3.78. Patient was evaluated by nephrology. It is felt that his changes were secondary to diabetic nephropathy. He is also noted to have nephrotic range  proteinuria. This did gradually improve as patient's condition stabilized as well. Patient however did not require hemodialysis during his hospital stay.   With continued therapy patient's blood sugars gradually improved. His wounds did progress to the point that he was stable for discharge home. He would need to have her home health nursing care as well. This was arranged for him at the time of discharge. Patient is presently feeling better at this time. He has less exertional dyspnea. He would need further renal followup as well as followup with surgery as an outpatient.   Significant Diagnostic Studies: Creatinine at time of discharge was 3.71. Pro BNP 1052.   Disposition: 01-Home or Self Care  Discharge Orders    Future Appointments: Provider: Department: Dept Phone: Center:   11/15/2011 11:40 AM Adolph Pollack, MD [facility] [phone] None     Future Orders Please Complete By Expires   Diet Carb Modified      Increase activity slowly      Call MD for:  severe uncontrolled pain      Discharge instructions      Comments:   Follow up with primary care physician in two weeks.   Change dressing (specify)      Scheduling Instructions:   [facility]: wound care protocol, face to face, with 2x daily dressing changes.     Medication List  As of 11/01/2011  3:29 PM   STOP taking these medications         levalbuterol 1.25 MG/3ML nebulizer solution      sitaGLIPtin 100 MG tablet         TAKE these medications         albuterol 108 (90 BASE) MCG/ACT inhaler   Commonly  known as: PROVENTIL HFA;VENTOLIN HFA   Inhale 2 puffs into the lungs every 6 (six) hours as needed. For shortness of breath.      albuterol (5 MG/ML) 0.5% nebulizer solution   Commonly known as: PROVENTIL   Take 0.5 mLs (2.5 mg total) by nebulization every 4 (four) hours as needed for wheezing or shortness of breath.      aspirin EC 81 MG tablet   Take 81 mg by mouth daily.       budesonide-formoterol 160-4.5 MCG/ACT inhaler   Commonly known as: SYMBICORT   Inhale 2 puffs into the lungs 2 (two) times daily.      carvedilol 25 MG tablet   Commonly known as: COREG   Take 25 mg by mouth 2 (two) times daily with a meal.      darbepoetin 100 MCG/0.5ML Soln   Commonly known as: ARANESP   Inject 0.5 mLs (100 mcg total) into the skin every Thursday at 6pm.      furosemide 40 MG tablet   Commonly known as: LASIX   Take 1 tablet (40 mg total) by mouth 2 (two) times daily.      insulin aspart 100 UNIT/ML injection   Commonly known as: novoLOG   Inject 0-15 Units into the skin 3 (three) times daily with meals.      insulin glargine 100 UNIT/ML injection   Commonly known as: LANTUS   Inject 20 Units into the skin 2 (two) times daily.      levothyroxine 50 MCG tablet   Commonly known as: SYNTHROID, LEVOTHROID   Take 50 mcg by mouth daily.      linagliptin 5 MG Tabs tablet   Commonly known as: TRADJENTA   Take 1 tablet (5 mg total) by mouth daily.      lovastatin 40 MG tablet   Commonly known as: MEVACOR   Take 40 mg by mouth 2 (two) times daily.      ramipril 10 MG capsule   Commonly known as: ALTACE   Take 10 mg by mouth 2 (two) times daily.      Tamsulosin HCl 0.4 MG Caps   Commonly known as: FLOMAX   Take 0.4 mg by mouth daily.           Follow-up Information    Follow up with ROSENBOWER,TODD J, MD. Schedule an appointment as soon as possible for a visit in 2 weeks. (Call after your discharge.  Do wet to dry dressing changes twice a day till it heals in completely.)    Contact information:   [facility] [address] [place] [address] [phone]          Signed: August Saucer, Shonnie Poudrier 11/01/2011, 3:29 PM

## 2011-11-15 ENCOUNTER — Telehealth (INDEPENDENT_AMBULATORY_CARE_PROVIDER_SITE_OTHER): Payer: Self-pay

## 2011-11-15 ENCOUNTER — Ambulatory Visit (INDEPENDENT_AMBULATORY_CARE_PROVIDER_SITE_OTHER): Payer: Medicare Other | Admitting: General Surgery

## 2011-11-15 ENCOUNTER — Encounter (INDEPENDENT_AMBULATORY_CARE_PROVIDER_SITE_OTHER): Payer: Self-pay | Admitting: General Surgery

## 2011-11-15 VITALS — BP 135/62 | HR 78 | Temp 96.8°F | Ht 70.0 in | Wt 333.2 lb

## 2011-11-15 DIAGNOSIS — Z9889 Other specified postprocedural states: Secondary | ICD-10-CM

## 2011-11-15 NOTE — Telephone Encounter (Signed)
Pt's damp to dry dressing changes daily extended for 2 weeks per Dr. Abbey Chatters.  Spoke with [employer].

## 2011-11-15 NOTE — Patient Instructions (Signed)
Daily dressing change as written.

## 2011-11-15 NOTE — Progress Notes (Signed)
Patient ID: [name], male   DOB: December 22, 1935, 76 y.o.   MRN: [id number]  Chief Complaint  Patient presents with  . Follow-up    reck rectal abscess    HPI [name] is a 76 y.o. male.  HPI He is here for a visit following complex incision and drainage of a large right buttock abscess 6 weeks ago. He continues to undergo saline damp to dry dressing changes as the wound is healing in by secondary intention. The nurses are helping him with his at his home.  Past Medical History  Diagnosis Date  . Diabetes mellitus   . CHF (congestive heart failure)   . Asthma   . Osteoarthrosis, unspecified whether generalized or localized, unspecified site   . Chronic kidney disease, stage III (moderate)   . Obesity, unspecified   . Osteoarthrosis, unspecified whether generalized or localized, unspecified site   . Osteoporosis   . Depression   . Irregular heart beat   . OSA (obstructive sleep apnea)   . Sleep-related hypoventilation   . Blood transfusion   . COPD (chronic obstructive pulmonary disease)   . Heart attack     Past Surgical History  Procedure Date  . Knee surgery   . Back surgery   . Cardiac surgery     5 bipasses, carotid artery  . Rectal abscess     Family History  Problem Relation Age of Onset  . Heart disease Mother   . Heart disease Father     Social History History  Substance Use Topics  . Smoking status: Never Smoker   . Smokeless tobacco: Never Used  . Alcohol Use: Yes     2 times a year    Allergies  Allergen Reactions  . Penicillins     Pt denies  Allergy to me this may be an error  . Sulfa Antibiotics     Information from Childhood.  He didn't know reaction    Current Outpatient Prescriptions  Medication Sig Dispense Refill  . albuterol (PROVENTIL HFA;VENTOLIN HFA) 108 (90 BASE) MCG/ACT inhaler Inhale 2 puffs into the lungs every 6 (six) hours as needed. For shortness of breath.      Marland Kitchen albuterol (PROVENTIL) (5 MG/ML) 0.5% nebulizer  solution Take 0.5 mLs (2.5 mg total) by nebulization every 4 (four) hours as needed for wheezing or shortness of breath.  60 mL  6  . aspirin EC 81 MG tablet Take 81 mg by mouth daily.      . budesonide-formoterol (SYMBICORT) 160-4.5 MCG/ACT inhaler Inhale 2 puffs into the lungs 2 (two) times daily.      . carvedilol (COREG) 25 MG tablet Take 25 mg by mouth 2 (two) times daily with a meal.      . darbepoetin (ARANESP) 100 MCG/0.5ML SOLN Inject 0.5 mLs (100 mcg total) into the skin every Thursday at 6pm.  4.2 mL  4  . furosemide (LASIX) 40 MG tablet Take 1 tablet (40 mg total) by mouth 2 (two) times daily.  60 tablet  2  . insulin aspart (NOVOLOG) 100 UNIT/ML injection Inject 0-15 Units into the skin 3 (three) times daily with meals.  1 vial  3  . insulin glargine (LANTUS) 100 UNIT/ML injection Inject 20 Units into the skin 2 (two) times daily.       Marland Kitchen levothyroxine (SYNTHROID, LEVOTHROID) 50 MCG tablet Take 50 mcg by mouth daily.      Marland Kitchen linagliptin (TRADJENTA) 5 MG TABS tablet Take 1 tablet (5 mg  total) by mouth daily.  30 tablet  3  . lovastatin (MEVACOR) 40 MG tablet Take 40 mg by mouth 2 (two) times daily.      . ramipril (ALTACE) 10 MG capsule Take 10 mg by mouth 2 (two) times daily.      . Tamsulosin HCl (FLOMAX) 0.4 MG CAPS Take 0.4 mg by mouth daily.        Review of Systems Review of Systems  Respiratory: Positive for shortness of breath.     Blood pressure 135/62, pulse 78, temperature 96.8 F (36 C), temperature source Temporal, height 5\' 10"  (1.778 m), weight 333 lb 3.2 oz (151.139 kg), SpO2 98.00%.  Physical Exam Physical Exam  Constitutional:       Morbidly obese male in no acute distress.  Genitourinary:       Right buttock wound is clean with granulation tissue present. No evidence of recurrent infection. I repacked the wound.    Data Reviewed Hospital notes  Assessment   Complex right buttock abscess status post incision and drainage-wound is healing in well by  secondary intenti    Plan    Continue daily saline damp to dry dressing changes. Return visit one month.       [name] 11/15/2011, 11:34 AM

## 2011-11-24 ENCOUNTER — Other Ambulatory Visit (HOSPITAL_COMMUNITY): Payer: Self-pay | Admitting: *Deleted

## 2011-11-29 ENCOUNTER — Telehealth (INDEPENDENT_AMBULATORY_CARE_PROVIDER_SITE_OTHER): Payer: Self-pay | Admitting: General Surgery

## 2011-11-29 ENCOUNTER — Encounter (HOSPITAL_COMMUNITY)
Admission: RE | Admit: 2011-11-29 | Discharge: 2011-11-29 | Disposition: A | Discharge status: Home / Self Care | Payer: Medicare Other | Source: Ambulatory Visit | Attending: Nephrology | Admitting: Nephrology

## 2011-11-29 DIAGNOSIS — D509 Iron deficiency anemia, unspecified: Secondary | ICD-10-CM | POA: Insufficient documentation

## 2011-11-29 MED ORDER — FERUMOXYTOL INJECTION 510 MG/17 ML
510.0000 mg | INTRAVENOUS | Status: DC
  Administered 2011-11-29: 510 mg via INTRAVENOUS
  Filled 2011-11-29: qty 17

## 2011-11-29 MED ORDER — SODIUM CHLORIDE 0.9 % IV SOLN
INTRAVENOUS | Status: DC
  Administered 2011-11-29: 13:00:00 via INTRAVENOUS

## 2011-11-29 NOTE — Telephone Encounter (Signed)
Toni Amend, nurse with [facility], calling for orders x3.  (1.) Current orders for home visits need to be extended.  (2) Pt's wound is now 0.3 x 3.6 x 0.1 cm and is too shallow to pack now.   Recommending using vaseline gauze twice a week.  (3.)  Home health care will need orders to "recertify" after 60 days, so will need that as well.  Please advise and will call her back.

## 2011-11-30 ENCOUNTER — Telehealth (INDEPENDENT_AMBULATORY_CARE_PROVIDER_SITE_OTHER): Payer: Self-pay | Admitting: General Surgery

## 2011-11-30 NOTE — Telephone Encounter (Signed)
Returning call regarding Home Health orders from Dr. Abbey Chatters:  Yes, extend current orders for [facility], Yes, recertify the care, and Yes, continue dressing the wound for him.  Toni Amend, nurse for [facility] updated.

## 2011-12-06 ENCOUNTER — Encounter (HOSPITAL_COMMUNITY)
Admission: RE | Admit: 2011-12-06 | Discharge: 2011-12-06 | Disposition: A | Discharge status: Home / Self Care | Payer: Medicare Other | Source: Ambulatory Visit | Attending: Nephrology | Admitting: Nephrology

## 2011-12-06 MED ORDER — SODIUM CHLORIDE 0.9 % IV SOLN
Freq: Once | INTRAVENOUS | Status: AC
  Administered 2011-12-06: 13:00:00 via INTRAVENOUS

## 2011-12-06 MED ORDER — FERUMOXYTOL INJECTION 510 MG/17 ML
510.0000 mg | Freq: Once | INTRAVENOUS | Status: AC
  Administered 2011-12-06: 510 mg via INTRAVENOUS
  Filled 2011-12-06: qty 17

## 2011-12-18 ENCOUNTER — Encounter (INDEPENDENT_AMBULATORY_CARE_PROVIDER_SITE_OTHER): Payer: Medicare Other | Admitting: General Surgery

## 2012-01-04 ENCOUNTER — Other Ambulatory Visit: Payer: Self-pay

## 2012-01-04 DIAGNOSIS — Z01810 Encounter for preprocedural cardiovascular examination: Secondary | ICD-10-CM

## 2012-01-04 DIAGNOSIS — N184 Chronic kidney disease, stage 4 (severe): Secondary | ICD-10-CM

## 2012-01-16 ENCOUNTER — Encounter: Payer: Self-pay | Admitting: Vascular Surgery

## 2012-01-17 ENCOUNTER — Encounter (INDEPENDENT_AMBULATORY_CARE_PROVIDER_SITE_OTHER): Payer: Medicare Other | Admitting: *Deleted

## 2012-01-17 ENCOUNTER — Ambulatory Visit (INDEPENDENT_AMBULATORY_CARE_PROVIDER_SITE_OTHER): Payer: Medicare Other | Admitting: Vascular Surgery

## 2012-01-17 ENCOUNTER — Encounter: Payer: Self-pay | Admitting: Vascular Surgery

## 2012-01-17 VITALS — BP 153/88 | HR 64 | Resp 16 | Ht 70.0 in | Wt 337.0 lb

## 2012-01-17 DIAGNOSIS — N184 Chronic kidney disease, stage 4 (severe): Secondary | ICD-10-CM

## 2012-01-17 DIAGNOSIS — N186 End stage renal disease: Secondary | ICD-10-CM | POA: Insufficient documentation

## 2012-01-17 DIAGNOSIS — Z01810 Encounter for preprocedural cardiovascular examination: Secondary | ICD-10-CM

## 2012-01-17 NOTE — Progress Notes (Signed)
[facility] [place]  Patient name: [name] MRN: [id number] DOB: 06/22/1935 Sex: male  REASON FOR CONSULT: evaluate for new access. Referred by Dr. Goldsborough.  HPI: [name] is a 76 y.o. male who is not yet on dialysis. He is right-handed. He was referred for evaluation for hemodialysis access. He has not had any recent uremic symptoms. Specifically he denies nausea, vomiting, fatigue, palpitations, or anorexia.  I have reviewed his records from [facility]. GFR is 15. Hemoglobin is 10.5.  Past Medical History  Diagnosis Date  . Diabetes mellitus   . CHF (congestive heart failure)   . Asthma   . Osteoarthrosis, unspecified whether generalized or localized, unspecified site   . Chronic kidney disease, stage III (moderate)   . Obesity, unspecified   . Osteoarthrosis, unspecified whether generalized or localized, unspecified site   . Osteoporosis   . Depression   . Irregular heart beat   . OSA (obstructive sleep apnea)   . Sleep-related hypoventilation   . Blood transfusion   . COPD (chronic obstructive pulmonary disease)   . Heart attack   . CAD (coronary artery disease)     Family History  Problem Relation Age of Onset  . Heart disease Mother   . Hyperlipidemia Mother   . Hypertension Mother   . Heart disease Father   . Heart attack Father     SOCIAL HISTORY: History  Substance Use Topics  . Smoking status: Never Smoker   . Smokeless tobacco: Never Used  . Alcohol Use: Yes     2 times a year    Allergies  Allergen Reactions  . Penicillins     Pt denies  Allergy to me this may be an error  . Sulfa Antibiotics     Information from Childhood.  He didn't know reaction    Current Outpatient Prescriptions  Medication Sig Dispense Refill  . albuterol (PROVENTIL HFA;VENTOLIN HFA) 108 (90 BASE) MCG/ACT inhaler Inhale 2 puffs into the lungs every 6 (six) hours as needed. For shortness of breath.      . albuterol  (PROVENTIL) (5 MG/ML) 0.5% nebulizer solution Take 0.5 mLs (2.5 mg total) by nebulization every 4 (four) hours as needed for wheezing or shortness of breath.  60 mL  6  . aspirin EC 81 MG tablet Take 81 mg by mouth daily.      . budesonide-formoterol (SYMBICORT) 160-4.5 MCG/ACT inhaler Inhale 2 puffs into the lungs 2 (two) times daily.      . carvedilol (COREG) 25 MG tablet Take 25 mg by mouth 2 (two) times daily with a meal.      . furosemide (LASIX) 40 MG tablet Take 1 tablet (40 mg total) by mouth 2 (two) times daily.  60 tablet  2  . insulin aspart (NOVOLOG) 100 UNIT/ML injection Inject 0-15 Units into the skin 3 (three) times daily with meals.  1 vial  3  . insulin glargine (LANTUS) 100 UNIT/ML injection Inject 20 Units into the skin 2 (two) times daily.       . levothyroxine (SYNTHROID, LEVOTHROID) 50 MCG tablet Take 50 mcg by mouth daily.      . linagliptin (TRADJENTA) 5 MG TABS tablet Take 1 tablet (5 mg total) by mouth daily.  30 tablet  3  . lovastatin (MEVACOR) 40 MG tablet Take 40 mg by mouth 2 (two) times daily.      . Tamsulosin HCl (FLOMAX) 0.4 MG CAPS Take 0.4 mg by mouth daily.      .   darbepoetin (ARANESP) 100 MCG/0.5ML SOLN Inject 0.5 mLs (100 mcg total) into the skin every Thursday at 6pm.  4.2 mL  4  . ramipril (ALTACE) 10 MG capsule Take 10 mg by mouth 2 (two) times daily.        REVIEW OF SYSTEMS: [X ] denotes positive finding; [  ] denotes negative finding  CARDIOVASCULAR:  [ ] chest pain   [ ] chest pressure   [ ] palpitations   [ ] orthopnea   [ ] dyspnea on exertion   [ ] claudication   [ ] rest pain   [ ] DVT   [ ] phlebitis PULMONARY:   [ ] productive cough   [X ] asthma   [ ] wheezing NEUROLOGIC:   [ ] weakness  [ ] paresthesias  [ ] aphasia  [ ] amaurosis  [ ] dizziness HEMATOLOGIC:   [ ] bleeding problems   [ ] clotting disorders MUSCULOSKELETAL:  [ ] joint pain   [ ] joint swelling [ ] leg swelling GASTROINTESTINAL: [ ]  blood in stool  [ ]   hematemesis GENITOURINARY:  [ ]  dysuria  [ ]  hematuria PSYCHIATRIC:  [X ] history of major depression INTEGUMENTARY:  [ ] rashes  [ ] ulcers CONSTITUTIONAL:  [ ] fever   [ ] chills  PHYSICAL EXAM: Filed Vitals:   01/17/12 1624  BP: 153/88  Pulse: 64  Resp: 16  Height: 5' 10" (1.778 m)  Weight: 337 lb (152.862 kg)  SpO2: 99%   Body mass index is 48.35 kg/(m^2). GENERAL: The patient is a well-nourished male, in no acute distress. The vital signs are documented above. CARDIOVASCULAR: There is a regular rate and rhythm without significant murmur appreciated. He has palpable brachial and radial pulses bilaterally. PULMONARY: There is good air exchange bilaterally without wheezing or rales. ABDOMEN: Soft and non-tender with normal pitched bowel sounds.  MUSCULOSKELETAL: There are no major deformities or cyanosis. NEUROLOGIC: No focal weakness or paresthesias are detected. SKIN: There are no ulcers or rashes noted. PSYCHIATRIC: The patient has a normal affect.  DATA:  I have independently interpreted his vein mapping which shows a reasonable left forearm cephalic vein, upper arm cephalic vein, and basilic vein on the left. Likewise the forearm cephalic vein on the right, the upper arm cephalic vein on the right, and the right basilic vein a reasonable in size.  MEDICAL ISSUES: I've recommended that we explore his forearm cephalic vein on the left. If this is not adequate he could potentially have a brachial cephalic fistula or a basilic vein transposition on the left. I have explained the indications for placement of an AV fistula or AV graft. I've explained that if at all possible we will place an AV fistula.  I have reviewed the risks of placement of an AV fistula including but not limited to: failure of the fistula to mature, need for subsequent interventions, and thrombosis. In addition I have reviewed the potential complications of placement of an AV graft. These risks include, but are  not limited to, graft thrombosis, graft infection, wound healing problems, bleeding, arm swelling, and steal syndrome. All the patient's questions were answered and they are agreeable to proceed with surgery. His surgery is scheduled for 01/23/2012.   DICKSON,CHRISTOPHER S [facility] [place] Beeper: [phone]    

## 2012-01-18 ENCOUNTER — Other Ambulatory Visit: Payer: Self-pay

## 2012-01-18 ENCOUNTER — Encounter: Payer: Self-pay | Admitting: Vascular Surgery

## 2012-01-18 ENCOUNTER — Encounter (HOSPITAL_COMMUNITY): Payer: Self-pay | Admitting: Pharmacy Technician

## 2012-01-29 ENCOUNTER — Encounter (HOSPITAL_COMMUNITY): Payer: Self-pay

## 2012-01-29 ENCOUNTER — Encounter (HOSPITAL_COMMUNITY)
Admission: RE | Admit: 2012-01-29 | Discharge: 2012-01-29 | Disposition: A | Discharge status: Home / Self Care | Payer: Medicare Other | Source: Ambulatory Visit | Attending: Vascular Surgery | Admitting: Vascular Surgery

## 2012-01-29 HISTORY — DX: Hypothyroidism, unspecified: E03.9

## 2012-01-29 NOTE — Progress Notes (Signed)
1230    SPOKE WITH JUDY KECK, RN AT [facility] .Marland KitchenMarland KitchenTOLD HER THAT THE PATIENT INFORMED ME THAT HE HAD NO ONE AVAILABLE TO STAY OVERNIGHT WITH HIM AFTER SURGERY...his SON WILL DRIVE HIM TO HOSPITAL.Marland Kitchen  DA

## 2012-01-29 NOTE — Pre-Procedure Instructions (Signed)
[address]  01/29/2012   Your procedure is scheduled on:  Thurs, Aug 22 @ 9:30 AM  Report to [facility] at 7:30 AM.  Call this number if you have problems the morning of surgery: [phone]   Remember:   Do not eat food:After Midnight.  Take these medicines the morning of surgery with A SIP OF WATER: Albuterol and Symbicort<Bring Your Inhaler With You>,Carvedilol(Coreg),Levothyroxine(Synthroid), and Flomax(Tamsulosin)   Do not wear jewelry  Do not wear lotions, powders, or colognes  Men may shave face and neck.  Do not bring valuables to the hospital.  Contacts, dentures or bridgework may not be worn into surgery.  Leave suitcase in the car. After surgery it may be brought to your room.  For patients admitted to the hospital, checkout time is 11:00 AM the day of discharge.   Patients discharged the day of surgery will not be allowed to drive home.  Special Instructions: CHG Shower Use Special Wash: 1/2 bottle night before surgery and 1/2 bottle morning of surgery.   Please read over the following fact sheets that you were given: Pain Booklet, Coughing and Deep Breathing, MRSA Information and Surgical Site Infection Prevention

## 2012-01-30 NOTE — Progress Notes (Signed)
1315   Tuesday........I spoke with the patient  He states that he thinks his sleep study was more then 5 yrs ago...and that Dr Minerva Areola Dean's office would have a copy.. I called Dean's office....the office is closed until Thursday...he is out of  Town until then............DA

## 2012-01-31 MED ORDER — VANCOMYCIN HCL IN DEXTROSE 1-5 GM/200ML-% IV SOLN
1000.0000 mg | INTRAVENOUS | Status: AC
  Administered 2012-02-01: 1000 mg via INTRAVENOUS
  Filled 2012-01-31: qty 200

## 2012-02-01 ENCOUNTER — Ambulatory Visit (HOSPITAL_COMMUNITY)
Admission: RE | Admit: 2012-02-01 | Discharge: 2012-02-01 | Disposition: A | Discharge status: Home / Self Care | Payer: Medicare Other | Source: Ambulatory Visit | Attending: Vascular Surgery | Admitting: Vascular Surgery

## 2012-02-01 ENCOUNTER — Ambulatory Visit (HOSPITAL_COMMUNITY): Payer: Medicare Other | Admitting: Anesthesiology

## 2012-02-01 ENCOUNTER — Encounter (HOSPITAL_COMMUNITY)
Admission: RE | Disposition: A | Discharge status: Home / Self Care | Payer: Self-pay | Source: Ambulatory Visit | Attending: Vascular Surgery

## 2012-02-01 ENCOUNTER — Encounter (HOSPITAL_COMMUNITY): Payer: Self-pay | Admitting: Anesthesiology

## 2012-02-01 ENCOUNTER — Telehealth: Payer: Self-pay | Admitting: Vascular Surgery

## 2012-02-01 ENCOUNTER — Encounter (HOSPITAL_COMMUNITY): Payer: Self-pay | Admitting: *Deleted

## 2012-02-01 DIAGNOSIS — G4733 Obstructive sleep apnea (adult) (pediatric): Secondary | ICD-10-CM | POA: Insufficient documentation

## 2012-02-01 DIAGNOSIS — I2510 Atherosclerotic heart disease of native coronary artery without angina pectoris: Secondary | ICD-10-CM | POA: Insufficient documentation

## 2012-02-01 DIAGNOSIS — Z9861 Coronary angioplasty status: Secondary | ICD-10-CM | POA: Insufficient documentation

## 2012-02-01 DIAGNOSIS — F3289 Other specified depressive episodes: Secondary | ICD-10-CM | POA: Insufficient documentation

## 2012-02-01 DIAGNOSIS — Z01812 Encounter for preprocedural laboratory examination: Secondary | ICD-10-CM | POA: Insufficient documentation

## 2012-02-01 DIAGNOSIS — N186 End stage renal disease: Secondary | ICD-10-CM

## 2012-02-01 DIAGNOSIS — M129 Arthropathy, unspecified: Secondary | ICD-10-CM | POA: Insufficient documentation

## 2012-02-01 DIAGNOSIS — N183 Chronic kidney disease, stage 3 unspecified: Secondary | ICD-10-CM | POA: Insufficient documentation

## 2012-02-01 DIAGNOSIS — J4489 Other specified chronic obstructive pulmonary disease: Secondary | ICD-10-CM | POA: Insufficient documentation

## 2012-02-01 DIAGNOSIS — I509 Heart failure, unspecified: Secondary | ICD-10-CM | POA: Insufficient documentation

## 2012-02-01 DIAGNOSIS — E039 Hypothyroidism, unspecified: Secondary | ICD-10-CM | POA: Insufficient documentation

## 2012-02-01 DIAGNOSIS — J449 Chronic obstructive pulmonary disease, unspecified: Secondary | ICD-10-CM | POA: Insufficient documentation

## 2012-02-01 DIAGNOSIS — I739 Peripheral vascular disease, unspecified: Secondary | ICD-10-CM | POA: Insufficient documentation

## 2012-02-01 DIAGNOSIS — I252 Old myocardial infarction: Secondary | ICD-10-CM | POA: Insufficient documentation

## 2012-02-01 DIAGNOSIS — F329 Major depressive disorder, single episode, unspecified: Secondary | ICD-10-CM | POA: Insufficient documentation

## 2012-02-01 DIAGNOSIS — E119 Type 2 diabetes mellitus without complications: Secondary | ICD-10-CM | POA: Insufficient documentation

## 2012-02-01 DIAGNOSIS — I129 Hypertensive chronic kidney disease with stage 1 through stage 4 chronic kidney disease, or unspecified chronic kidney disease: Secondary | ICD-10-CM | POA: Insufficient documentation

## 2012-02-01 HISTORY — PX: AV FISTULA PLACEMENT: SHX1204

## 2012-02-01 LAB — GLUCOSE, CAPILLARY: Glucose-Capillary: 202 mg/dL — ABNORMAL HIGH (ref 70–99)

## 2012-02-01 LAB — POCT I-STAT 4, (NA,K, GLUC, HGB,HCT): Glucose, Bld: 193 mg/dL — ABNORMAL HIGH (ref 70–99)

## 2012-02-01 SURGERY — ARTERIOVENOUS (AV) FISTULA CREATION
Anesthesia: General | Site: Arm Lower | Laterality: Left | Wound class: Clean

## 2012-02-01 MED ORDER — MEPERIDINE HCL 25 MG/ML IJ SOLN: 6.2500 mg | INTRAMUSCULAR | Status: DC | PRN

## 2012-02-01 MED ORDER — SODIUM CHLORIDE 0.9 % IV SOLN
INTRAVENOUS | Status: DC
  Administered 2012-02-01: 11:00:00 via INTRAVENOUS

## 2012-02-01 MED ORDER — 0.9 % SODIUM CHLORIDE (POUR BTL) OPTIME
TOPICAL | Status: DC | PRN
  Administered 2012-02-01: 1000 mL

## 2012-02-01 MED ORDER — PROTAMINE SULFATE 10 MG/ML IV SOLN
INTRAVENOUS | Status: DC | PRN
  Administered 2012-02-01: 50 mg via INTRAVENOUS
  Administered 2012-02-01: 20 mg via INTRAVENOUS

## 2012-02-01 MED ORDER — LIDOCAINE HCL (PF) 1 % IJ SOLN
INTRAMUSCULAR | Status: AC
  Filled 2012-02-01: qty 30

## 2012-02-01 MED ORDER — MIDAZOLAM HCL 5 MG/5ML IJ SOLN
INTRAMUSCULAR | Status: DC | PRN
  Administered 2012-02-01: 2 mg via INTRAVENOUS

## 2012-02-01 MED ORDER — ONDANSETRON HCL 4 MG/2ML IJ SOLN
INTRAMUSCULAR | Status: DC | PRN
  Administered 2012-02-01: 4 mg via INTRAVENOUS

## 2012-02-01 MED ORDER — FENTANYL CITRATE 0.05 MG/ML IJ SOLN
INTRAMUSCULAR | Status: DC | PRN
  Administered 2012-02-01: 100 ug via INTRAVENOUS

## 2012-02-01 MED ORDER — MIDAZOLAM HCL 2 MG/2ML IJ SOLN: 0.5000 mg | Freq: Once | INTRAMUSCULAR | Status: DC | PRN

## 2012-02-01 MED ORDER — LIDOCAINE HCL (PF) 1 % IJ SOLN
INTRAMUSCULAR | Status: DC | PRN
  Administered 2012-02-01: 13 mL

## 2012-02-01 MED ORDER — OXYCODONE-ACETAMINOPHEN 5-325 MG PO TABS: 1.0000 | ORAL_TABLET | ORAL | Status: AC | PRN

## 2012-02-01 MED ORDER — PROMETHAZINE HCL 25 MG/ML IJ SOLN: 6.2500 mg | INTRAMUSCULAR | Status: DC | PRN

## 2012-02-01 MED ORDER — LIDOCAINE HCL (CARDIAC) 20 MG/ML IV SOLN
INTRAVENOUS | Status: DC | PRN
  Administered 2012-02-01: 50 mg via INTRAVENOUS

## 2012-02-01 MED ORDER — HEPARIN SODIUM (PORCINE) 1000 UNIT/ML IJ SOLN
INTRAMUSCULAR | Status: DC | PRN
  Administered 2012-02-01: 9000 [IU] via INTRAVENOUS

## 2012-02-01 MED ORDER — FENTANYL CITRATE 0.05 MG/ML IJ SOLN: 25.0000 ug | INTRAMUSCULAR | Status: DC | PRN

## 2012-02-01 MED ORDER — PROPOFOL 10 MG/ML IV EMUL
INTRAVENOUS | Status: DC | PRN
  Administered 2012-02-01: 100 mg via INTRAVENOUS

## 2012-02-01 MED ORDER — SODIUM CHLORIDE 0.9 % IR SOLN
Status: DC | PRN
  Administered 2012-02-01: 12:00:00

## 2012-02-01 MED ORDER — EPHEDRINE SULFATE 50 MG/ML IJ SOLN
INTRAMUSCULAR | Status: DC | PRN
  Administered 2012-02-01 (×2): 10 mg via INTRAVENOUS

## 2012-02-01 SURGICAL SUPPLY — 41 items
CANISTER SUCTION 2500CC (MISCELLANEOUS) ×2 IMPLANT
CLIP TI MEDIUM 6 (CLIP) ×2 IMPLANT
CLIP TI WIDE RED SMALL 6 (CLIP) ×2 IMPLANT
CLOTH BEACON ORANGE TIMEOUT ST (SAFETY) ×2 IMPLANT
COVER PROBE W GEL 5X96 (DRAPES) IMPLANT
COVER SURGICAL LIGHT HANDLE (MISCELLANEOUS) ×2 IMPLANT
DECANTER SPIKE VIAL GLASS SM (MISCELLANEOUS) ×2 IMPLANT
DERMABOND ADVANCED (GAUZE/BANDAGES/DRESSINGS) ×1
DERMABOND ADVANCED .7 DNX12 (GAUZE/BANDAGES/DRESSINGS) ×1 IMPLANT
DRAIN PENROSE 1/2X12 LTX STRL (WOUND CARE) IMPLANT
ELECT REM PT RETURN 9FT ADLT (ELECTROSURGICAL) ×2
ELECTRODE REM PT RTRN 9FT ADLT (ELECTROSURGICAL) ×1 IMPLANT
GEL ULTRASOUND 20GR AQUASONIC (MISCELLANEOUS) ×2 IMPLANT
GLOVE BIO SURGEON STRL SZ7.5 (GLOVE) ×4 IMPLANT
GLOVE BIO SURGEON STRL SZ8.5 (GLOVE) ×2 IMPLANT
GLOVE BIOGEL PI IND STRL 6.5 (GLOVE) ×1 IMPLANT
GLOVE BIOGEL PI IND STRL 7.0 (GLOVE) ×1 IMPLANT
GLOVE BIOGEL PI IND STRL 7.5 (GLOVE) ×3 IMPLANT
GLOVE BIOGEL PI INDICATOR 6.5 (GLOVE) ×1
GLOVE BIOGEL PI INDICATOR 7.0 (GLOVE) ×1
GLOVE BIOGEL PI INDICATOR 7.5 (GLOVE) ×3
GLOVE SS BIOGEL STRL SZ 7 (GLOVE) ×1 IMPLANT
GLOVE SUPERSENSE BIOGEL SZ 7 (GLOVE) ×1
GLOVE SURG SS PI 7.5 STRL IVOR (GLOVE) ×2 IMPLANT
GOWN PREVENTION PLUS XLARGE (GOWN DISPOSABLE) ×2 IMPLANT
GOWN STRL NON-REIN LRG LVL3 (GOWN DISPOSABLE) ×6 IMPLANT
KIT BASIN OR (CUSTOM PROCEDURE TRAY) ×2 IMPLANT
KIT ROOM TURNOVER OR (KITS) ×2 IMPLANT
NS IRRIG 1000ML POUR BTL (IV SOLUTION) ×2 IMPLANT
PACK CV ACCESS (CUSTOM PROCEDURE TRAY) ×2 IMPLANT
PAD ARMBOARD 7.5X6 YLW CONV (MISCELLANEOUS) ×4 IMPLANT
SPONGE GAUZE 4X4 12PLY (GAUZE/BANDAGES/DRESSINGS) ×2 IMPLANT
SPONGE SURGIFOAM ABS GEL 100 (HEMOSTASIS) IMPLANT
SUT PROLENE 6 0 BV (SUTURE) ×2 IMPLANT
SUT VIC AB 3-0 SH 27 (SUTURE) ×2
SUT VIC AB 3-0 SH 27X BRD (SUTURE) ×2 IMPLANT
SUT VICRYL 4-0 PS2 18IN ABS (SUTURE) ×4 IMPLANT
TOWEL OR 17X24 6PK STRL BLUE (TOWEL DISPOSABLE) ×2 IMPLANT
TOWEL OR 17X26 10 PK STRL BLUE (TOWEL DISPOSABLE) ×2 IMPLANT
UNDERPAD 30X30 INCONTINENT (UNDERPADS AND DIAPERS) ×2 IMPLANT
WATER STERILE IRR 1000ML POUR (IV SOLUTION) ×2 IMPLANT

## 2012-02-01 NOTE — Transfer of Care (Signed)
Immediate Anesthesia Transfer of Care Note  Patient: [name]  Procedure(s) Performed: Procedure(s) (LRB): ARTERIOVENOUS (AV) FISTULA CREATION (Left)  Patient Location: PACU  Anesthesia Type: General  Level of Consciousness: awake, alert  and oriented  Airway & Oxygen Therapy: Patient Spontanous Breathing and Patient connected to face mask oxygen  Post-op Assessment: Report given to PACU RN and Post -op Vital signs reviewed and stable  Post vital signs: Reviewed and stable  Complications: No apparent anesthesia complications

## 2012-02-01 NOTE — H&P (View-Only) (Signed)
[facility] and [facility]in Specialist of [place]  Patient name: [name] MRN: [id number] DOB: 1936/03/01 Sex: male  REASON FOR CONSULT: evaluate for new access. Referred by Dr. Kathrene Bongo.  HPI: [name] is a 76 y.o. male who is not yet on dialysis. He is right-handed. He was referred for evaluation for hemodialysis access. He has not had any recent uremic symptoms. Specifically he denies nausea, vomiting, fatigue, palpitations, or anorexia.  I have reviewed his records from [facility]. GFR is 15. Hemoglobin is 10.5.  Past Medical History  Diagnosis Date  . Diabetes mellitus   . CHF (congestive heart failure)   . Asthma   . Osteoarthrosis, unspecified whether generalized or localized, unspecified site   . Chronic kidney disease, stage III (moderate)   . Obesity, unspecified   . Osteoarthrosis, unspecified whether generalized or localized, unspecified site   . Osteoporosis   . Depression   . Irregular heart beat   . OSA (obstructive sleep apnea)   . Sleep-related hypoventilation   . Blood transfusion   . COPD (chronic obstructive pulmonary disease)   . Heart attack   . CAD (coronary artery disease)     Family History  Problem Relation Age of Onset  . Heart disease Mother   . Hyperlipidemia Mother   . Hypertension Mother   . Heart disease Father   . Heart attack Father     SOCIAL HISTORY: History  Substance Use Topics  . Smoking status: Never Smoker   . Smokeless tobacco: Never Used  . Alcohol Use: Yes     2 times a year    Allergies  Allergen Reactions  . Penicillins     Pt denies  Allergy to me this may be an error  . Sulfa Antibiotics     Information from Childhood.  He didn't know reaction    Current Outpatient Prescriptions  Medication Sig Dispense Refill  . albuterol (PROVENTIL HFA;VENTOLIN HFA) 108 (90 BASE) MCG/ACT inhaler Inhale 2 puffs into the lungs every 6 (six) hours as needed. For shortness of breath.      Marland Kitchen albuterol  (PROVENTIL) (5 MG/ML) 0.5% nebulizer solution Take 0.5 mLs (2.5 mg total) by nebulization every 4 (four) hours as needed for wheezing or shortness of breath.  60 mL  6  . aspirin EC 81 MG tablet Take 81 mg by mouth daily.      . budesonide-formoterol (SYMBICORT) 160-4.5 MCG/ACT inhaler Inhale 2 puffs into the lungs 2 (two) times daily.      . carvedilol (COREG) 25 MG tablet Take 25 mg by mouth 2 (two) times daily with a meal.      . furosemide (LASIX) 40 MG tablet Take 1 tablet (40 mg total) by mouth 2 (two) times daily.  60 tablet  2  . insulin aspart (NOVOLOG) 100 UNIT/ML injection Inject 0-15 Units into the skin 3 (three) times daily with meals.  1 vial  3  . insulin glargine (LANTUS) 100 UNIT/ML injection Inject 20 Units into the skin 2 (two) times daily.       Marland Kitchen levothyroxine (SYNTHROID, LEVOTHROID) 50 MCG tablet Take 50 mcg by mouth daily.      Marland Kitchen linagliptin (TRADJENTA) 5 MG TABS tablet Take 1 tablet (5 mg total) by mouth daily.  30 tablet  3  . lovastatin (MEVACOR) 40 MG tablet Take 40 mg by mouth 2 (two) times daily.      . Tamsulosin HCl (FLOMAX) 0.4 MG CAPS Take 0.4 mg by mouth daily.      Marland Kitchen  darbepoetin (ARANESP) 100 MCG/0.5ML SOLN Inject 0.5 mLs (100 mcg total) into the skin every Thursday at 6pm.  4.2 mL  4  . ramipril (ALTACE) 10 MG capsule Take 10 mg by mouth 2 (two) times daily.        REVIEW OF SYSTEMS: Arly.Keller ] denotes positive finding; [  ] denotes negative finding  CARDIOVASCULAR:  [ ]  chest pain   [ ]  chest pressure   [ ]  palpitations   [ ]  orthopnea   [ ]  dyspnea on exertion   [ ]  claudication   [ ]  rest pain   [ ]  DVT   [ ]  phlebitis PULMONARY:   [ ]  productive cough   Arly.Keller ] asthma   [ ]  wheezing NEUROLOGIC:   [ ]  weakness  [ ]  paresthesias  [ ]  aphasia  [ ]  amaurosis  [ ]  dizziness HEMATOLOGIC:   [ ]  bleeding problems   [ ]  clotting disorders MUSCULOSKELETAL:  [ ]  joint pain   [ ]  joint swelling [ ]  leg swelling GASTROINTESTINAL: [ ]   blood in stool  [ ]    hematemesis GENITOURINARY:  [ ]   dysuria  [ ]   hematuria PSYCHIATRIC:  Arly.Keller ] history of major depression INTEGUMENTARY:  [ ]  rashes  [ ]  ulcers CONSTITUTIONAL:  [ ]  fever   [ ]  chills  PHYSICAL EXAM: Filed Vitals:   01/17/12 1624  BP: 153/88  Pulse: 64  Resp: 16  Height: 5\' 10"  (1.778 m)  Weight: 337 lb (152.862 kg)  SpO2: 99%   Body mass index is 48.35 kg/(m^2). GENERAL: The patient is a well-nourished male, in no acute distress. The vital signs are documented above. CARDIOVASCULAR: There is a regular rate and rhythm without significant murmur appreciated. He has palpable brachial and radial pulses bilaterally. PULMONARY: There is good air exchange bilaterally without wheezing or rales. ABDOMEN: Soft and non-tender with normal pitched bowel sounds.  MUSCULOSKELETAL: There are no major deformities or cyanosis. NEUROLOGIC: No focal weakness or paresthesias are detected. SKIN: There are no ulcers or rashes noted. PSYCHIATRIC: The patient has a normal affect.  DATA:  I have independently interpreted his vein mapping which shows a reasonable left forearm cephalic vein, upper arm cephalic vein, and basilic vein on the left. Likewise the forearm cephalic vein on the right, the upper arm cephalic vein on the right, and the right basilic vein a reasonable in size.  MEDICAL ISSUES: I've recommended that we explore his forearm cephalic vein on the left. If this is not adequate he could potentially have a brachial cephalic fistula or a basilic vein transposition on the left. I have explained the indications for placement of an AV fistula or AV graft. I've explained that if at all possible we will place an AV fistula.  I have reviewed the risks of placement of an AV fistula including but not limited to: failure of the fistula to mature, need for subsequent interventions, and thrombosis. In addition I have reviewed the potential complications of placement of an AV graft. These risks include, but are  not limited to, graft thrombosis, graft infection, wound healing problems, bleeding, arm swelling, and steal syndrome. All the patient's questions were answered and they are agreeable to proceed with surgery. His surgery is scheduled for 01/23/2012.   Zuleyka Kloc S [facility] [place] Beeper: [phone]

## 2012-02-01 NOTE — Anesthesia Preprocedure Evaluation (Addendum)
Anesthesia Evaluation  Patient identified by MRN, date of birth, ID band Patient awake    Reviewed: Allergy & Precautions, H&P , NPO status , Patient's Chart, lab work & pertinent test results, reviewed documented beta blocker date and time   History of Anesthesia Complications Negative for: history of anesthetic complications  Airway Mallampati: I TM Distance: >3 FB Neck ROM: Full    Dental  (+) Chipped and Dental Advisory Given   Pulmonary asthma , sleep apnea, Continuous Positive Airway Pressure Ventilation and Oxygen sleep apnea , COPD COPD inhaler and oxygen dependent,    + wheezing      Cardiovascular hypertension, Pt. on medications and Pt. on home beta blockers + CAD, + Past MI, + Cardiac Stents (LAD stent), + CABG (s/p CABG 3 years ago), + Peripheral Vascular Disease (s/p CEA) and +CHF Rhythm:Regular Rate:Normal  ECHO 4/13: EF 45-50%, valves OK Cath '08: 50% in-stent stenosis, otherwise mod non-obstructive ASCADz   Neuro/Psych PSYCHIATRIC DISORDERS Depression negative neurological ROS     GI/Hepatic negative GI ROS, Neg liver ROS,   Endo/Other  Well Controlled, Type 2, Insulin DependentHypothyroidism Morbid obesity  Renal/GU ESRFRenal disease (K+ 4.7, no dialysis yet)     Musculoskeletal   Abdominal (+) + obese,   Peds  Hematology   Anesthesia Other Findings   Reproductive/Obstetrics                          Anesthesia Physical Anesthesia Plan  ASA: III  Anesthesia Plan: General   Post-op Pain Management:    Induction: Intravenous  Airway Management Planned: LMA  Additional Equipment:   Intra-op Plan:   Post-operative Plan: Extubation in OR  Informed Consent: I have reviewed the patients History and Physical, chart, labs and discussed the procedure including the risks, benefits and alternatives for the proposed anesthesia with the patient or authorized representative who has  indicated his/her understanding and acceptance.   Dental advisory given  Plan Discussed with: CRNA and Surgeon  Anesthesia Plan Comments: (Plan routine monitors, GA-LMA Ok)       Anesthesia Quick Evaluation

## 2012-02-01 NOTE — Telephone Encounter (Signed)
Message copied by Fredrich Birks on Thu Feb 01, 2012  2:51 PM ------      Message from: Melene Plan      Created: Thu Feb 01, 2012  2:33 PM      Regarding: FW: charge and follow up                   ----- Message -----         From: Chuck Hint, MD         Sent: 02/01/2012   1:59 PM           To: Reuel Derby, Melene Plan, RN      Subject: charge and follow up                                     PROCEDURE: left radial cephalic AV fistula with ligation of 3 competing branches            SURGEON: Di Kindle. Edilia Bo, MD, FACS            ASSIST: Lianne Cure PA            He will need a follow up visit in 6 weeks with a duplex to check on the maturation of this fistula.      CSD

## 2012-02-01 NOTE — Telephone Encounter (Signed)
LVM for pt re: follow up appt

## 2012-02-01 NOTE — Interval H&P Note (Signed)
History and Physical Interval Note:  02/01/2012 11:32 AM  [name]  has presented today for surgery, with the diagnosis of End Stage Renal Disease  The various methods of treatment have been discussed with the patient and family. After consideration of risks, benefits and other options for treatment, the patient has consented to: ARTERIOVENOUS (AV) FISTULA CREATION (Left) as a surgical intervention .  The patient's history has been reviewed, patient examined, no change in status, stable for surgery.  I have reviewed the patient's chart and labs.  Questions were answered to the patient's satisfaction.     Anilah Huck S

## 2012-02-01 NOTE — Op Note (Signed)
NAME: [name]   MRN: [id number] DOB: [birth year]    DATE OF OPERATION: 02/01/2012  PREOP DIAGNOSIS: chronic kidney disease  POSTOP DIAGNOSIS: same  PROCEDURE: left radial cephalic AV fistula with ligation of 3 competing branches  SURGEON: Di Kindle. Edilia Bo, MD, FACS  ASSIST: Lianne Cure PA  ANESTHESIA: Gen.   EBL: minimal  INDICATIONS: [name] is a 76 y.o. male who is not yet on dialysis. He is brought in for elective placement of an AV fistula.  FINDINGS: 3.5 mm vein. Calcified artery.  TECHNIQUE: Patient was taken to the operating room and received a general anesthetic. The cephalic vein was fairly far lateral in the forearm and therefore elected to make 2 incisions at the wrist. An incision was made over the cephalic vein which was dissected free. It was ligated distally and irrigated out with heparinized saline. It was a 3.5 mm vein. A separate incision was made over the radial artery. The artery was markedly calcified. A tunnel was created between the 2 incisions. The patient was heparinized. The radial artery was clamped proximally and distally. A longitudinal arteriotomy was made. The vein was spatulated and sewn end to side to the artery using continuous 6-0 Prolene suture. Next through the incision over the vein there was a proximal branch which was identified and clipped. Also on preoperative ultrasound identified a large branch at the midpoint of the forearm which was marked. A transverse incision was made at this level and this branch was ligated. 2 additional branches were ligated through this incision that were smaller. The vein in the midforearm had some spasm. Hemostasis was obtained in the wounds. Each of the wounds was closed deep layer 3-0 Vicryl and the skin closed with 4-0 Vicryl. Dermabond was applied. The patient tolerated the procedure well and was transferred to the recovery room in stable condition. All needle and sponge counts were  correct.  Austin Ferrari, MD, FACS [facility]  DATE OF DICTATION:   02/01/2012

## 2012-02-01 NOTE — Anesthesia Postprocedure Evaluation (Signed)
  Anesthesia Post-op Note  Patient: [name]  Procedure(s) Performed: Procedure(s) (LRB): ARTERIOVENOUS (AV) FISTULA CREATION (Left)  Patient Location: PACU  Anesthesia Type: General  Level of Consciousness: awake, alert  and oriented  Airway and Oxygen Therapy: Patient Spontanous Breathing  Post-op Pain: none  Post-op Assessment: Post-op Vital signs reviewed, Patient's Cardiovascular Status Stable, Respiratory Function Stable, Patent Airway, No signs of Nausea or vomiting and Pain level controlled  Post-op Vital Signs: Reviewed and stable  Complications: No apparent anesthesia complications

## 2012-02-01 NOTE — Preoperative (Signed)
Beta Blockers   Reason not to administer Beta Blockers:Not Applicable. Coreg 25 mg @ 0600 02/01/12

## 2012-02-01 NOTE — Anesthesia Procedure Notes (Signed)
Procedure Name: LMA Insertion Date/Time: 02/01/2012 12:06 PM Performed by: Garen Lah Pre-anesthesia Checklist: Patient identified, Timeout performed, Emergency Drugs available, Suction available and Patient being monitored Patient Re-evaluated:Patient Re-evaluated prior to inductionOxygen Delivery Method: Circle system utilized Preoxygenation: Pre-oxygenation with 100% oxygen Intubation Type: IV induction LMA: LMA inserted LMA Size: 5.0 Number of attempts: 1 Placement Confirmation: positive ETCO2 and breath sounds checked- equal and bilateral Tube secured with: Tape Dental Injury: Teeth and Oropharynx as per pre-operative assessment

## 2012-02-02 ENCOUNTER — Encounter (HOSPITAL_COMMUNITY): Payer: Self-pay | Admitting: Vascular Surgery

## 2012-02-02 ENCOUNTER — Other Ambulatory Visit: Payer: Self-pay | Admitting: *Deleted

## 2012-02-02 DIAGNOSIS — N186 End stage renal disease: Secondary | ICD-10-CM

## 2012-02-02 DIAGNOSIS — Z48812 Encounter for surgical aftercare following surgery on the circulatory system: Secondary | ICD-10-CM

## 2012-03-04 ENCOUNTER — Other Ambulatory Visit (HOSPITAL_COMMUNITY): Payer: Self-pay | Admitting: *Deleted

## 2012-03-06 ENCOUNTER — Encounter (HOSPITAL_COMMUNITY)
Admission: RE | Admit: 2012-03-06 | Discharge: 2012-03-06 | Disposition: A | Discharge status: Home / Self Care | Payer: Medicare Other | Source: Ambulatory Visit | Attending: Nephrology | Admitting: Nephrology

## 2012-03-06 DIAGNOSIS — D509 Iron deficiency anemia, unspecified: Secondary | ICD-10-CM | POA: Insufficient documentation

## 2012-03-06 MED ORDER — FERUMOXYTOL INJECTION 510 MG/17 ML
INTRAVENOUS | Status: AC
  Administered 2012-03-06: 510 mg via INTRAVENOUS
  Filled 2012-03-06: qty 17

## 2012-03-06 MED ORDER — SODIUM CHLORIDE 0.9 % IV SOLN
INTRAVENOUS | Status: DC
  Administered 2012-03-06: 13:00:00 via INTRAVENOUS

## 2012-03-06 MED ORDER — FERUMOXYTOL INJECTION 510 MG/17 ML: 510.0000 mg | INTRAVENOUS | Status: DC

## 2012-03-12 ENCOUNTER — Other Ambulatory Visit (HOSPITAL_COMMUNITY): Payer: Self-pay | Admitting: *Deleted

## 2012-03-13 ENCOUNTER — Encounter (HOSPITAL_COMMUNITY): Payer: Medicare Other

## 2012-03-19 ENCOUNTER — Encounter: Payer: Self-pay | Admitting: Vascular Surgery

## 2012-03-20 ENCOUNTER — Ambulatory Visit: Payer: Medicare Other | Admitting: Vascular Surgery

## 2012-04-24 IMAGING — CR DG CHEST 2V
3 series · 3 of 3 positions shown · non-contrast
Comparison: Chest x-ray 03/15/2007.

CLINICAL DATA: Shortness of breath.

CHEST - 2 VIEW

[x chest ap (1 of 2)]
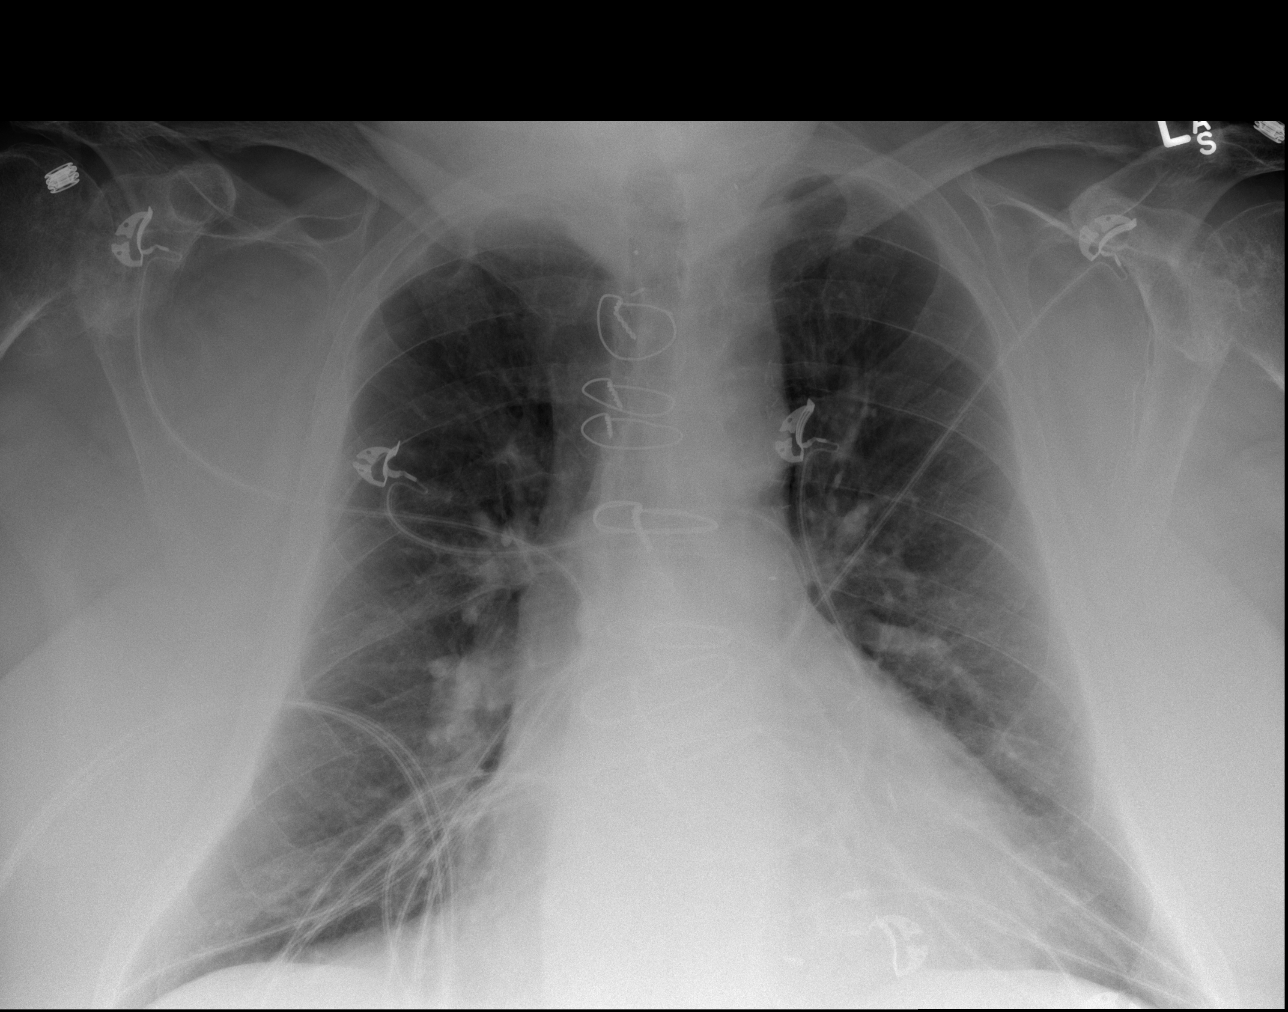

[x chest ap (2 of 2)]
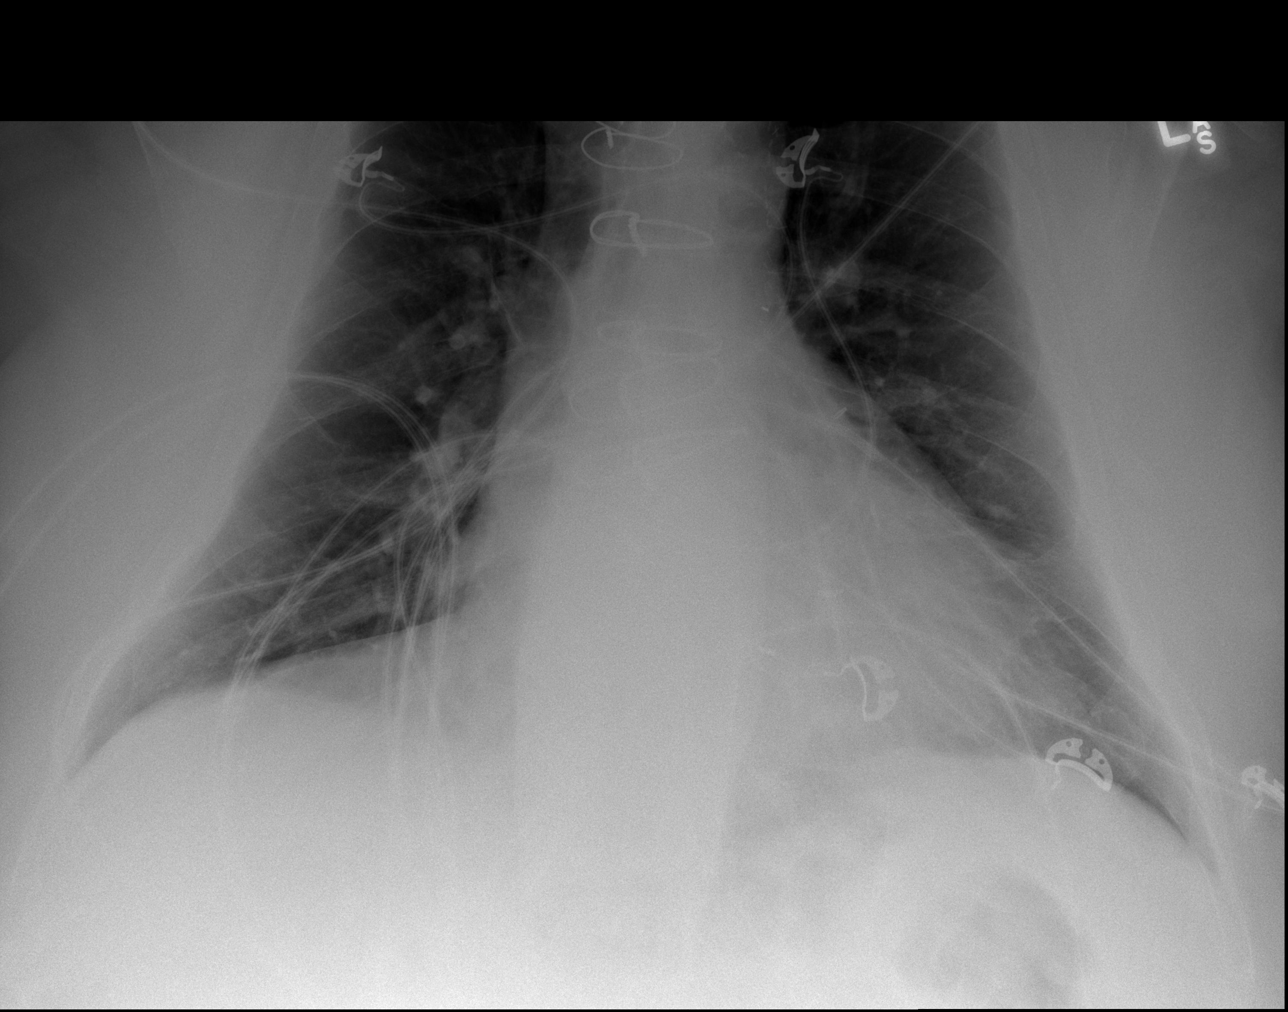

[w chest lat]
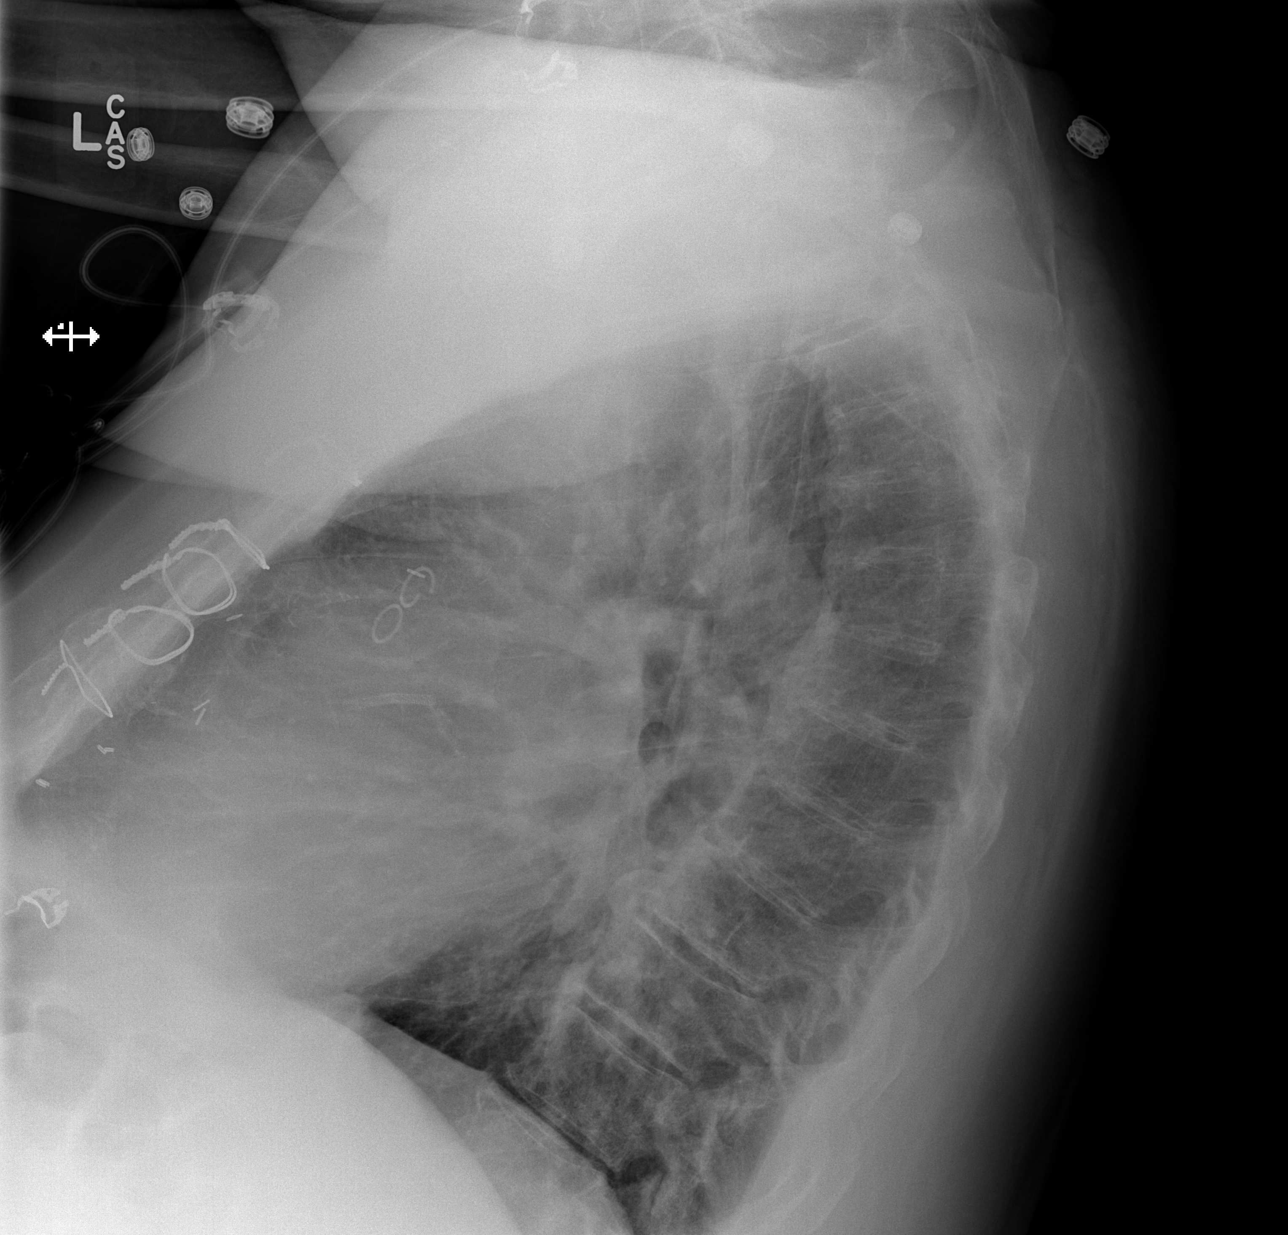

[3 of 3 positions shown; findings below may reference images not displayed]

FINDINGS: Lung volumes are normal.  No consolidative airspace
disease.  No definite pleural effusions (lateral projection is
limited by lack of visualization of the inferior costophrenic
sulci).  Pulmonary vasculature is normal.  Mild cardiomegaly is
unchanged.  Mediastinal contours are unremarkable. The patient is
status post median sternotomyfor CABG. Atherosclerotic
calcifications are noted within the arch of the aorta.  A small
stent is in place, likely within the left anterior descending
coronary artery.
IMPRESSION: 1.  No definite radiographic evidence of acute cardiopulmonary
disease.
2.  Mild cardiomegaly is unchanged.
3.  Postoperative and post procedural changes, as above.

## 2012-04-25 IMAGING — US US RENAL
1 series · 14 of 25 positions shown · non-contrast
Comparison: None

CLINICAL DATA: Acute renal failure.

RENAL/URINARY TRACT ULTRASOUND COMPLETE

[Series 1: us renal · 0.37mm/px · 14 of 45 slices shown]
[im 1/45]
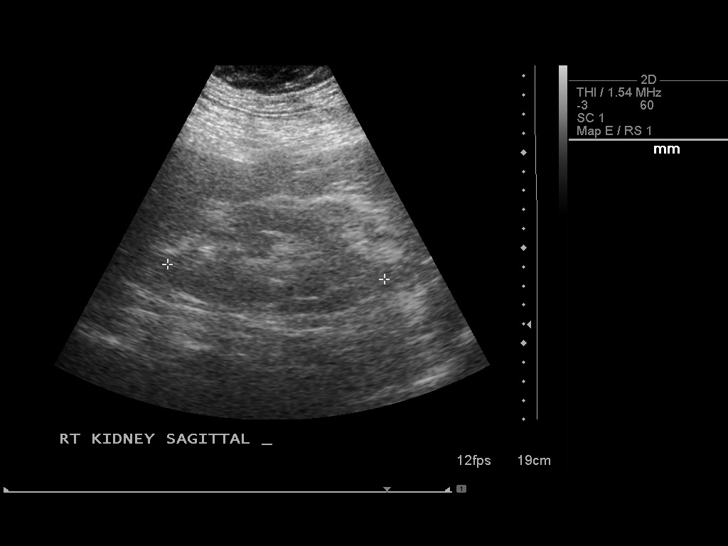
[im 4/45]
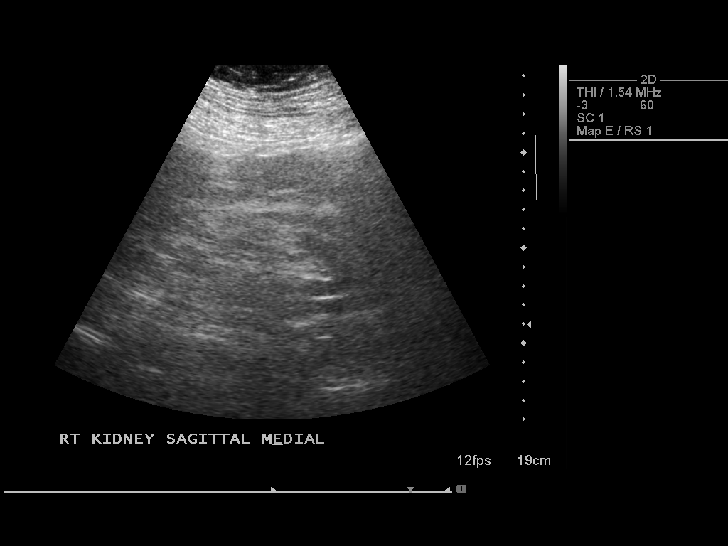
[im 8/45]
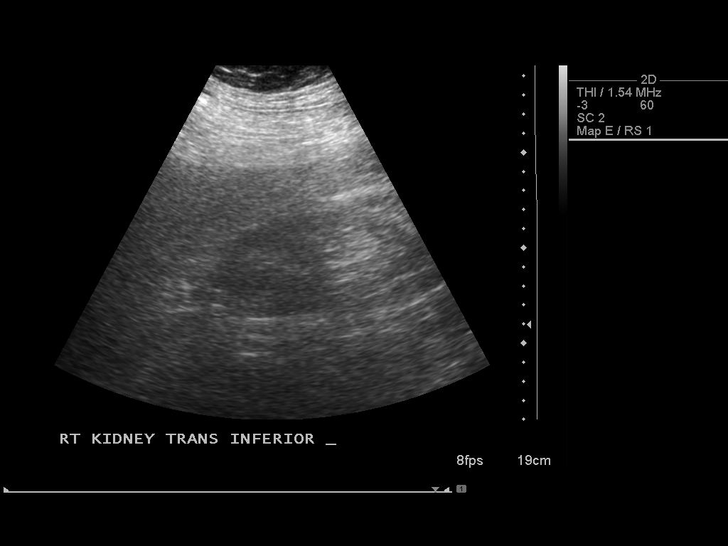
[im 12/45]
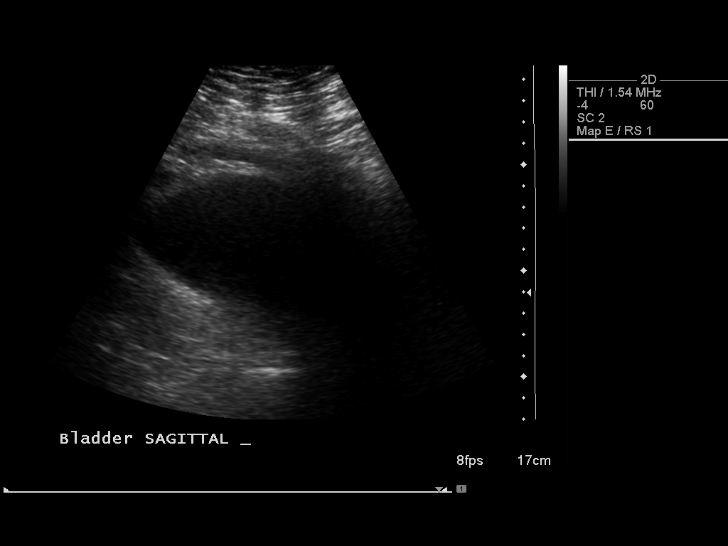
[im 15/45]
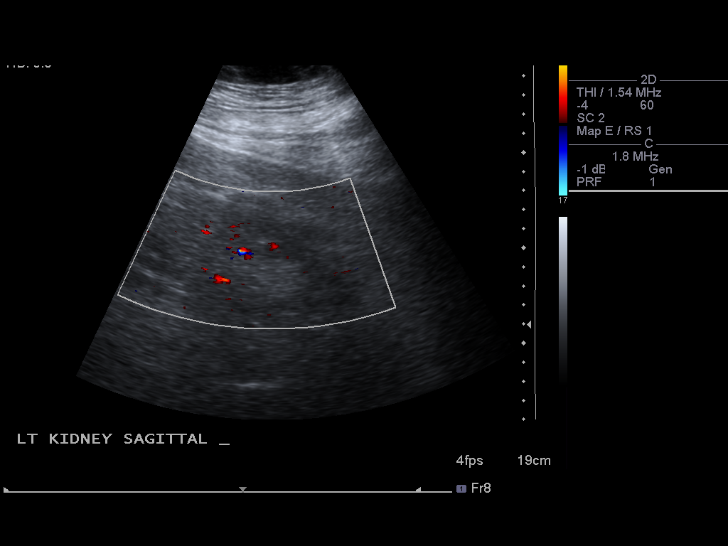
[im 17/45]
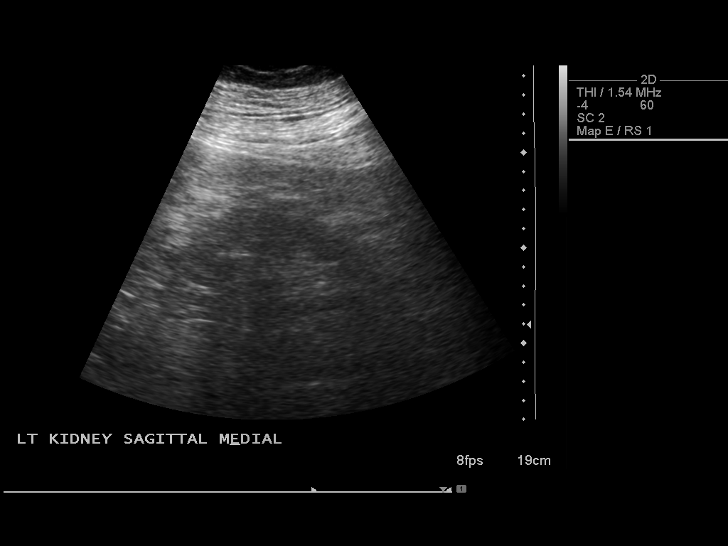
[im 21/45]
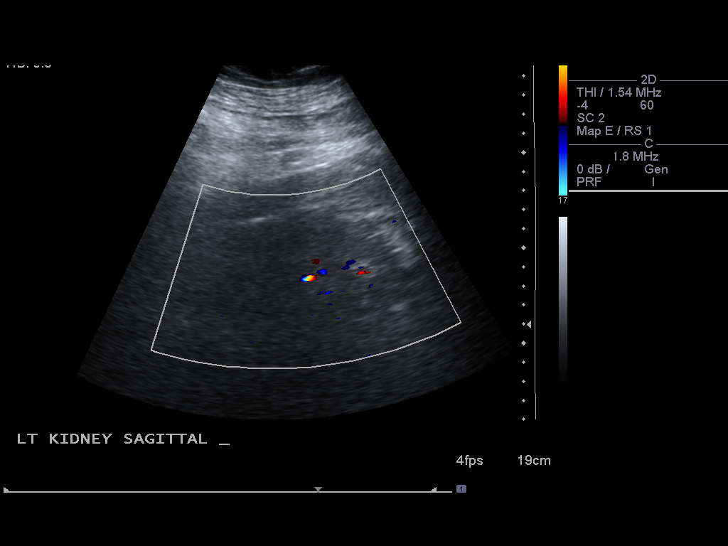
[im 24/45]
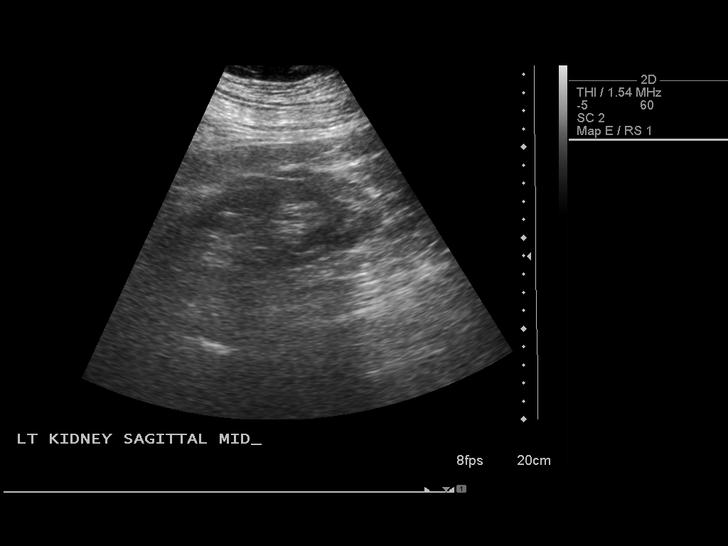
[im 28/45]
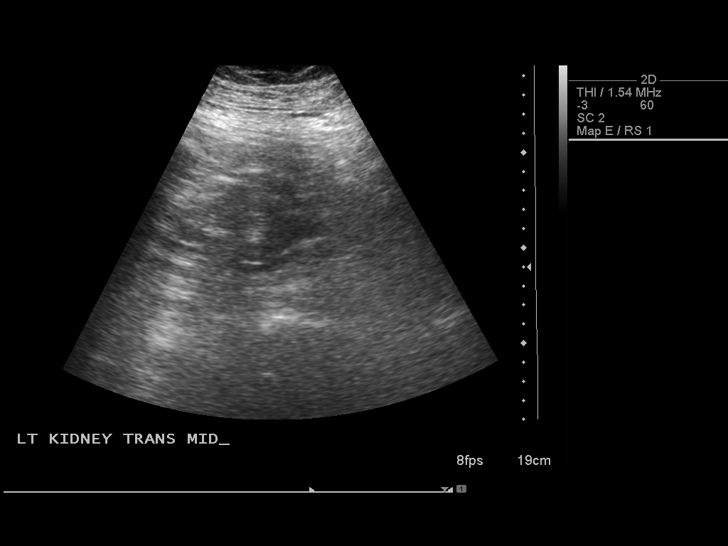
[im 30/45]
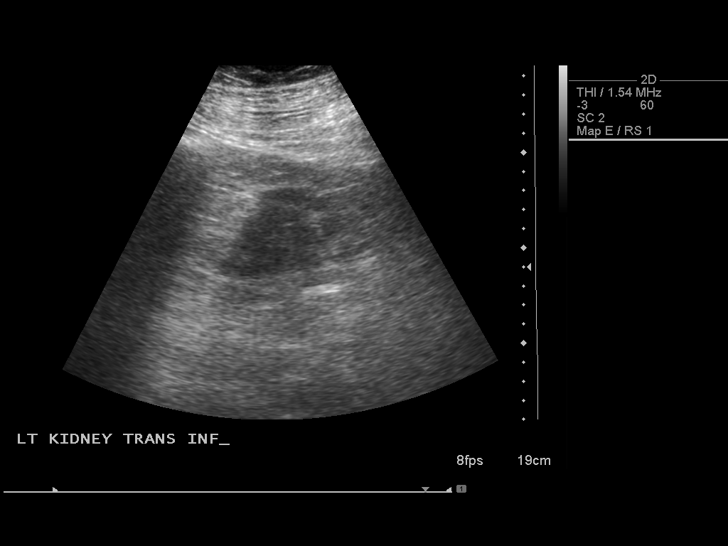
[im 34/45]
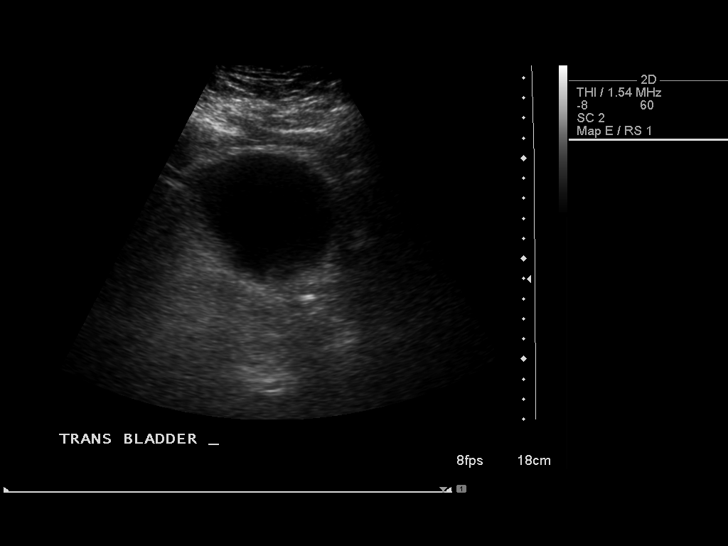
[im 37/45]
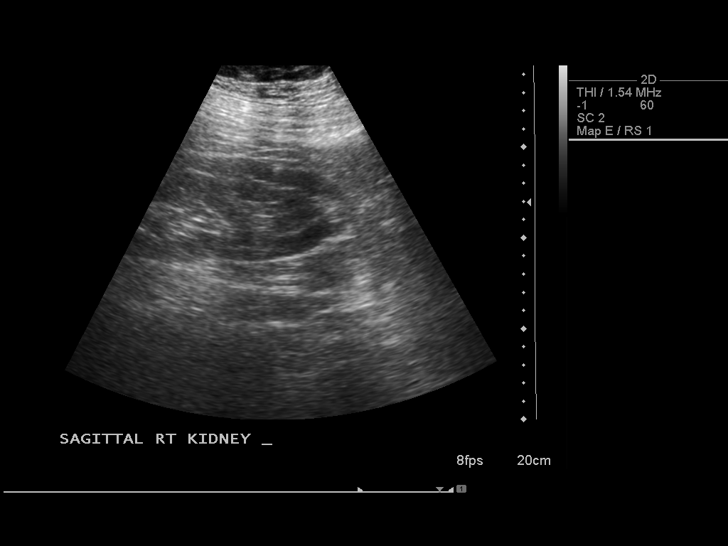
[im 41/45]
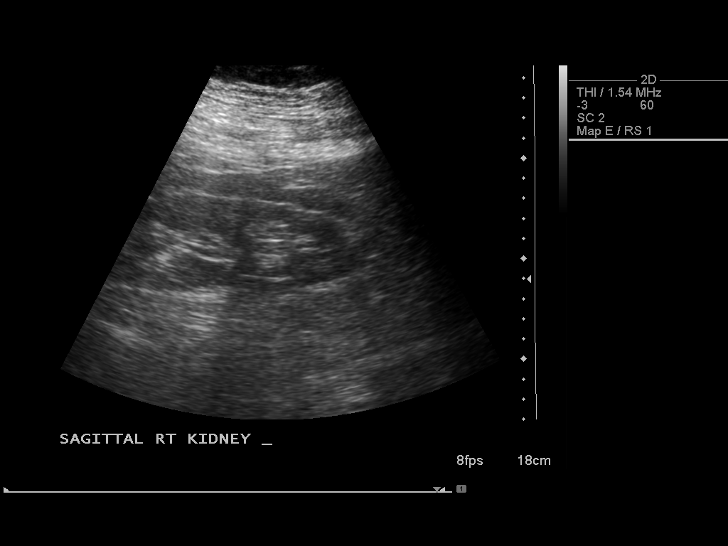
[im 45/45]
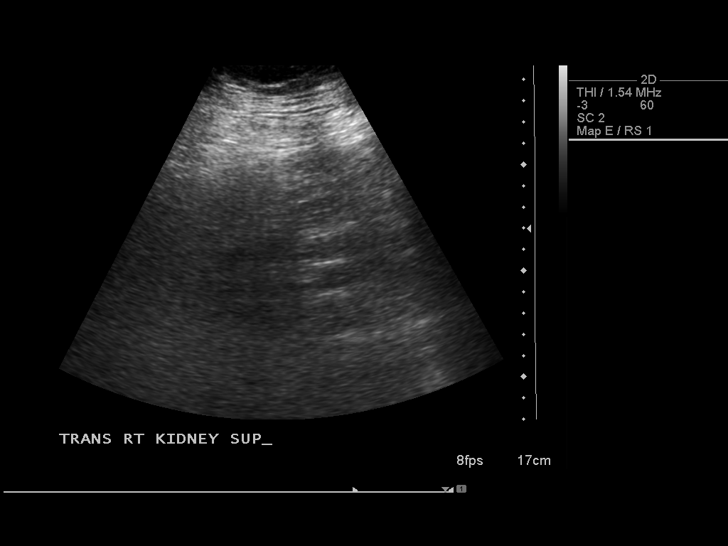

[14 of 25 positions shown; findings below may reference images not displayed]

FINDINGS: Right Kidney:  12.7 cm.  No hydronephrosis.  Normal echotexture.
Suspect slight cortical thinning.  Visualization is difficult due
to body habitus.  No visible focal abnormality.

Left Kidney:  12.2 cm.  Normal echotexture.  No hydronephrosis.
Suspect mild cortical thinning.  Again, visualization is difficult
due to body habitus.  The

Bladder:  Grossly unremarkable.
IMPRESSION: Limited visualization due to body habitus.  No hydronephrosis.
Suspect mild cortical thinning.

## 2012-07-15 LAB — BASIC METABOLIC PANEL
BUN: 56 mg/dL — AB (ref 4–21)
Glucose: 297 mg/dL
Potassium: 4.8 mmol/L (ref 3.4–5.3)
Sodium: 134 mmol/L — AB (ref 137–147)

## 2012-07-18 ENCOUNTER — Encounter: Payer: Self-pay | Admitting: *Deleted

## 2012-08-06 ENCOUNTER — Encounter: Payer: Self-pay | Admitting: Hematology

## 2012-10-10 ENCOUNTER — Telehealth: Payer: Self-pay | Admitting: *Deleted

## 2014-07-16 ENCOUNTER — Emergency Department (HOSPITAL_COMMUNITY)
Admission: EM | Admit: 2014-07-16 | Discharge: 2014-07-17 | Disposition: A | Discharge status: Home / Self Care | Payer: Medicare Other | Attending: Emergency Medicine | Admitting: Emergency Medicine

## 2014-07-16 ENCOUNTER — Encounter (HOSPITAL_COMMUNITY): Payer: Self-pay | Admitting: Emergency Medicine

## 2014-07-16 DIAGNOSIS — R7989 Other specified abnormal findings of blood chemistry: Secondary | ICD-10-CM | POA: Insufficient documentation

## 2014-07-16 DIAGNOSIS — I252 Old myocardial infarction: Secondary | ICD-10-CM | POA: Insufficient documentation

## 2014-07-16 DIAGNOSIS — Z794 Long term (current) use of insulin: Secondary | ICD-10-CM | POA: Diagnosis not present

## 2014-07-16 DIAGNOSIS — J449 Chronic obstructive pulmonary disease, unspecified: Secondary | ICD-10-CM | POA: Insufficient documentation

## 2014-07-16 DIAGNOSIS — Z79899 Other long term (current) drug therapy: Secondary | ICD-10-CM | POA: Insufficient documentation

## 2014-07-16 DIAGNOSIS — Z9889 Other specified postprocedural states: Secondary | ICD-10-CM

## 2014-07-16 DIAGNOSIS — R238 Other skin changes: Secondary | ICD-10-CM

## 2014-07-16 DIAGNOSIS — I2510 Atherosclerotic heart disease of native coronary artery without angina pectoris: Secondary | ICD-10-CM | POA: Insufficient documentation

## 2014-07-16 DIAGNOSIS — M1990 Unspecified osteoarthritis, unspecified site: Secondary | ICD-10-CM | POA: Diagnosis not present

## 2014-07-16 DIAGNOSIS — I509 Heart failure, unspecified: Secondary | ICD-10-CM | POA: Diagnosis not present

## 2014-07-16 DIAGNOSIS — E119 Type 2 diabetes mellitus without complications: Secondary | ICD-10-CM | POA: Diagnosis not present

## 2014-07-16 DIAGNOSIS — E669 Obesity, unspecified: Secondary | ICD-10-CM | POA: Insufficient documentation

## 2014-07-16 DIAGNOSIS — L988 Other specified disorders of the skin and subcutaneous tissue: Secondary | ICD-10-CM | POA: Insufficient documentation

## 2014-07-16 DIAGNOSIS — Z8659 Personal history of other mental and behavioral disorders: Secondary | ICD-10-CM | POA: Insufficient documentation

## 2014-07-16 DIAGNOSIS — Z8669 Personal history of other diseases of the nervous system and sense organs: Secondary | ICD-10-CM | POA: Diagnosis not present

## 2014-07-16 DIAGNOSIS — T82898A Other specified complication of vascular prosthetic devices, implants and grafts, initial encounter: Secondary | ICD-10-CM | POA: Insufficient documentation

## 2014-07-16 DIAGNOSIS — Z7982 Long term (current) use of aspirin: Secondary | ICD-10-CM | POA: Diagnosis not present

## 2014-07-16 DIAGNOSIS — N186 End stage renal disease: Secondary | ICD-10-CM | POA: Diagnosis not present

## 2014-07-16 DIAGNOSIS — Z7951 Long term (current) use of inhaled steroids: Secondary | ICD-10-CM | POA: Insufficient documentation

## 2014-07-16 DIAGNOSIS — Y841 Kidney dialysis as the cause of abnormal reaction of the patient, or of later complication, without mention of misadventure at the time of the procedure: Secondary | ICD-10-CM | POA: Insufficient documentation

## 2014-07-16 DIAGNOSIS — I120 Hypertensive chronic kidney disease with stage 5 chronic kidney disease or end stage renal disease: Secondary | ICD-10-CM | POA: Diagnosis not present

## 2014-07-16 DIAGNOSIS — E039 Hypothyroidism, unspecified: Secondary | ICD-10-CM | POA: Diagnosis not present

## 2014-07-16 DIAGNOSIS — Z992 Dependence on renal dialysis: Secondary | ICD-10-CM

## 2014-07-16 LAB — CBC WITH DIFFERENTIAL/PLATELET
BASOS ABS: 0 10*3/uL (ref 0.0–0.1)
Basophils Relative: 0 % (ref 0–1)
EOS PCT: 5 % (ref 0–5)
Eosinophils Absolute: 0.3 10*3/uL (ref 0.0–0.7)
HCT: 32.6 % — ABNORMAL LOW (ref 39.0–52.0)
Hemoglobin: 10.6 g/dL — ABNORMAL LOW (ref 13.0–17.0)
LYMPHS PCT: 18 % (ref 12–46)
Lymphs Abs: 1.2 10*3/uL (ref 0.7–4.0)
MCH: 31.2 pg (ref 26.0–34.0)
MCHC: 32.5 g/dL (ref 30.0–36.0)
MCV: 95.9 fL (ref 78.0–100.0)
Monocytes Absolute: 0.4 10*3/uL (ref 0.1–1.0)
Monocytes Relative: 7 % (ref 3–12)
NEUTROS PCT: 70 % (ref 43–77)
Neutro Abs: 4.6 10*3/uL (ref 1.7–7.7)
PLATELETS: 156 10*3/uL (ref 150–400)
RBC: 3.4 MIL/uL — ABNORMAL LOW (ref 4.22–5.81)
RDW: 15.8 % — AB (ref 11.5–15.5)
WBC: 6.5 10*3/uL (ref 4.0–10.5)

## 2014-07-16 LAB — ACETAMINOPHEN LEVEL: Acetaminophen (Tylenol), Serum: <10 ug/mL — ABNORMAL LOW (ref 10–30)

## 2014-07-16 LAB — SALICYLATE LEVEL: Salicylate Lvl: <4 mg/dL (ref 2.8–20.0)

## 2014-07-16 LAB — COMPREHENSIVE METABOLIC PANEL
ALT: 14 U/L (ref 0–53)
AST: 8 U/L (ref 0–37)
Albumin: 3.6 g/dL (ref 3.5–5.2)
Alkaline Phosphatase: 115 U/L (ref 39–117)
Anion gap: 19 — ABNORMAL HIGH (ref 5–15)
BUN: 88 mg/dL — ABNORMAL HIGH (ref 6–23)
CALCIUM: 6.9 mg/dL — AB (ref 8.4–10.5)
CO2: 22 mmol/L (ref 19–32)
Chloride: 96 mmol/L (ref 96–112)
Creatinine, Ser: 11.59 mg/dL — ABNORMAL HIGH (ref 0.50–1.35)
GFR calc Af Amer: 4 mL/min — ABNORMAL LOW (ref >90)
GFR calc non Af Amer: 4 mL/min — ABNORMAL LOW (ref >90)
Glucose, Bld: 136 mg/dL — ABNORMAL HIGH (ref 70–99)
POTASSIUM: 5.4 mmol/L — AB (ref 3.5–5.1)
SODIUM: 137 mmol/L (ref 135–145)
TOTAL PROTEIN: 8.4 g/dL — AB (ref 6.0–8.3)
Total Bilirubin: 0.5 mg/dL (ref 0.3–1.2)

## 2014-07-16 LAB — ETHANOL

## 2014-07-16 MED ORDER — NICOTINE 21 MG/24HR TD PT24
21.0000 mg | MEDICATED_PATCH | Freq: Every day | TRANSDERMAL | Status: DC
  Filled 2014-07-16: qty 1

## 2014-07-16 MED ORDER — ONDANSETRON HCL 4 MG PO TABS: 4.0000 mg | ORAL_TABLET | Freq: Three times a day (TID) | ORAL | Status: DC | PRN

## 2014-07-16 MED ORDER — ALUM & MAG HYDROXIDE-SIMETH 200-200-20 MG/5ML PO SUSP
30.0000 mL | ORAL | Status: DC | PRN
Start: 2014-07-16 — End: 2014-07-17

## 2014-07-16 NOTE — ED Provider Notes (Signed)
CSN: [id number]     Arrival date & time 07/16/14  1144 History   First MD Initiated Contact with Patient 07/16/14 1354     Chief Complaint  Patient presents with  . Vascular Access Problem     (Consider location/radiation/quality/duration/timing/severity/associated sxs/prior Treatment) HPI  Pt is a 79yo male with hx of CHF, asthma, OA, COPD, CAD, sleep apnea, and type II IDDM, hypothyroidism, depression, HTN, and CKD stage III on dialysis Tues/Thurs/Sat, presenting to ED as advised by Dr. Willey BladeEric Dean, pt's former PCP.  Pt has not f/u with Dr. August Saucerean for at least 1 year due to pt living in [place]. Pt does not recall name of his current PCP.  Per Dr. August Saucerean, pt's family called him with concern pt has been skipping dialysis due to not liking to be stuck, stating he did not want to live anymore.  Dr. August Saucerean is requesting pt be evaluated by vascular surgery to see if his fistula is adequate enough or if additional access needs to be obtained.  Dr. August Saucerean also requests pt be evaluated by psychiatry while admitted due to his severe depression.  Pt reports going to dialysis on Tuesday, 07/14/14. States he skipped it today because he does not want to be stuck anymore in his arm.  Denies chest pain, SOB, abdominal pain, n/v/d. Pt is c/o bilateral buttock pain.  Pt denies SI as he states he would go to dialysis if they did not have to stick him.  Past Medical History  Diagnosis Date  . CHF (congestive heart failure)   . Asthma   . Osteoarthrosis, unspecified whether generalized or localized, unspecified site   . Obesity, unspecified   . Osteoarthrosis, unspecified whether generalized or localized, unspecified site   . Osteoporosis   . Irregular heart beat   . Sleep-related hypoventilation   . Blood transfusion   . COPD (chronic obstructive pulmonary disease)   . Heart attack   . CAD (coronary artery disease)   . OSA (obstructive sleep apnea)     ??  settings are 13  . Diabetes mellitus     dx 5-6 yrs ago   . Hypothyroidism   . Depression   . Hypertension   . Chronic kidney disease, stage III (moderate)    Past Surgical History  Procedure Laterality Date  . Knee surgery    . Back surgery    . Cardiac surgery      5 bipasses, carotid artery  . Rectal abscess    . Coronary artery bypass graft    . Eye surgery      left eye  . Joint replacement      bil knees  . Carotid endarterectomy      left  . Av fistula placement  02/01/2012    Procedure: ARTERIOVENOUS (AV) FISTULA CREATION;  Surgeon: Chuck Hinthristopher S Dickson, MD;  [facility];  Service: Vascular;  Laterality: Left;   Family History  Problem Relation Age of Onset  . Heart disease Mother   . Hyperlipidemia Mother   . Hypertension Mother   . Heart disease Father   . Heart attack Father    History  Substance Use Topics  . Smoking status: Never Smoker   . Smokeless tobacco: Never Used  . Alcohol Use: No     Comment: 2 times a year    Review of Systems  Constitutional: Negative for fever, chills and fatigue.  Respiratory: Negative for cough and shortness of breath.   Cardiovascular: Negative for chest pain and  palpitations.  Gastrointestinal: Negative for nausea, vomiting, abdominal pain and diarrhea.  All other systems reviewed and are negative.     Allergies  Sulfa antibiotics  Home Medications   Prior to Admission medications   Medication Sig Start Date End Date Taking? Authorizing Provider  albuterol (PROVENTIL HFA;VENTOLIN HFA) 108 (90 BASE) MCG/ACT inhaler Inhale 2 puffs into the lungs every 6 (six) hours as needed. For shortness of breath.   Yes Historical Provider, MD  amLODipine (NORVASC) 5 MG tablet Take 5 mg by mouth daily.   Yes Historical Provider, MD  aspirin EC 81 MG tablet Take 81 mg by mouth daily.   Yes Historical Provider, MD  budesonide-formoterol (SYMBICORT) 160-4.5 MCG/ACT inhaler Inhale 2 puffs into the lungs 2 (two) times daily.   Yes Historical Provider, MD  carvedilol (COREG) 25 MG  tablet Take 25 mg by mouth 2 (two) times daily with a meal.   Yes Historical Provider, MD  darbepoetin (ARANESP) 100 MCG/0.5ML SOLN Inject 0.5 mLs (100 mcg total) into the skin every Thursday at 6pm. 10/03/11  Yes Gwenyth Bender, MD  Fluticasone-Salmeterol (ADVAIR) 100-50 MCG/DOSE AEPB Inhale 1 puff into the lungs every 12 (twelve) hours.   Yes Historical Provider, MD  furosemide (LASIX) 40 MG tablet Take 80 mg by mouth 2 (two) times daily.   Yes Historical Provider, MD  glipiZIDE (GLUCOTROL) 5 MG tablet Take 5 mg by mouth daily.   Yes Historical Provider, MD  insulin aspart (NOVOLOG) 100 UNIT/ML injection Inject 0-15 Units into the skin 3 (three) times daily before meals. Per sliding scale   Yes Historical Provider, MD  insulin glargine (LANTUS) 100 UNIT/ML injection Inject 20 Units into the skin 2 (two) times daily.    Yes Historical Provider, MD  levothyroxine (SYNTHROID, LEVOTHROID) 50 MCG tablet Take 50 mcg by mouth daily.   Yes Historical Provider, MD  linagliptin (TRADJENTA) 5 MG TABS tablet Take 1 tablet (5 mg total) by mouth daily. 10/03/11  Yes Gwenyth Bender, MD  lovastatin (MEVACOR) 40 MG tablet Take 40 mg by mouth 2 (two) times daily.   Yes Historical Provider, MD  ramipril (ALTACE) 10 MG capsule Take 10 mg by mouth 2 (two) times daily.   Yes Historical Provider, MD  sitaGLIPtin (JANUVIA) 100 MG tablet Take 100 mg by mouth daily.   Yes Historical Provider, MD  Tamsulosin HCl (FLOMAX) 0.4 MG CAPS Take 0.4 mg by mouth daily.   Yes Historical Provider, MD  albuterol (PROVENTIL) (5 MG/ML) 0.5% nebulizer solution Take 0.5 mLs (2.5 mg total) by nebulization every 4 (four) hours as needed for wheezing or shortness of breath. 10/03/11 10/02/12  Gwenyth Bender, MD   BP 151/71 mmHg  Pulse 86  Temp(Src) 98 F (36.7 C) (Oral)  Resp 14  SpO2 100% Physical Exam  Constitutional: He appears well-developed and well-nourished.  HENT:  Head: Normocephalic and atraumatic.  Eyes: Conjunctivae are normal. No  scleral icterus.  Neck: Normal range of motion.  Cardiovascular: Normal rate, regular rhythm and normal heart sounds.   Fistula in left forearm- thrill present, overlying skin ecchymosis mild erythema w/o evidence of underlying infection.   Pulmonary/Chest: Effort normal and breath sounds normal. No respiratory distress. He has no wheezes. He has no rales. He exhibits no tenderness.  Abdominal: Soft. Bowel sounds are normal. He exhibits no distension and no mass. There is no tenderness. There is no rebound and no guarding.  Musculoskeletal: Normal range of motion.  Neurological: He is alert.  Skin: Skin  is warm and dry. There is erythema.  Buttock: erythema and irritation w/o skin breakdown or ulcerations. No induration or fluctuance.   Nursing note and vitals reviewed.   ED Course  Procedures (including critical care time) Labs Review Labs Reviewed  CBC WITH DIFFERENTIAL/PLATELET - Abnormal; Notable for the following:    RBC 3.40 (*)    Hemoglobin 10.6 (*)    HCT 32.6 (*)    RDW 15.8 (*)    All other components within normal limits  COMPREHENSIVE METABOLIC PANEL - Abnormal; Notable for the following:    Potassium 5.4 (*)    Glucose, Bld 136 (*)    BUN 88 (*)    Creatinine, Ser 11.59 (*)    Calcium 6.9 (*)    Total Protein 8.4 (*)    GFR calc non Af Amer 4 (*)    GFR calc Af Amer 4 (*)    Anion gap 19 (*)    All other components within normal limits  URINE RAPID DRUG SCREEN (HOSP PERFORMED)  ETHANOL  SALICYLATE LEVEL  ACETAMINOPHEN LEVEL    Imaging Review No results found.   EKG Interpretation None      MDM   Final diagnoses:  History of vascular access device  Dialysis patient  Skin irritation  Elevated serum creatinine    Pt is a 79yo male with hx of depression and CKD stage III on dialysis Tues/Thurs/Sat.    2:22 PM Consulted with Dr. Willey Blade, pt's former PCP.  Dr. August Saucer reports receiving a call from pt's family with reports of pt refusing dialysis due  to "not wanting to live anymore"  Dr. August Saucer is concerned pt needs assistance with obtaining better vascular access for dialysis as well as evaluation by psychiatrist as pt has hx of severe depression.   Pt denies SI.  Pt is agreeable to continue with dialysis until an alternative method can be considered with vascular team.    3:18 PM Consulted with Dr. Kathrene Bongo, nephrology, pt does not need to be dialized emergently, however, strongly recommend pt be seen back at his dialysis center tomorrow for treatment as well as to discuss additional access options.    4:31 PM Consulted with [facility] who recommended pt come in for further evaluation and treatment of depression. Pt does not meet criteria for IVC, however, pt does agreed to be admitted to [facility].  Due to missing dialysis today and will likely be sent to [facility] tomorrow, pt will need to be dialized at some point before going to [facility].  Psych hold orders placed.   Pt signed out to Marlon Pel, PA-C at shift change to consult with nephrology to ensure pt is scheduled for dialysis.    Junius Finner, PA-C 07/16/14 1723  Nelia Shi, MD 07/17/14 [phone]

## 2014-07-16 NOTE — ED Notes (Signed)
Per [name]: [name] is currently dialysis [name] on T/TH/Sa reports that he no longer wishes to have dialysis because he does not want fistula "stuck so many times." [name] and stopped taking medications because he didn't want to anymore. [name] denies any SI/hI at this time, per family they would like [name] to have psychiatric eval for comments on healthcare.    Per Baxter HireKristen at [facility], [name] will be psych admission and nephrology will be consulted to see if [name] can have dialysis today. [name] not IVC'D at this time. [name] calm and cooperative.

## 2014-07-16 NOTE — ED Notes (Signed)
Pt states that his bottom is hurting, reddened area noted to sacrum, barrier cream applied allevyn to sacrum.

## 2014-07-16 NOTE — Progress Notes (Signed)
CSW faxed patient referral to [facility] following facilities: [facility], [facility], [facility], [facility], [facility]

## 2014-07-16 NOTE — ED Notes (Signed)
Security at bedside to wand pt. 

## 2014-07-16 NOTE — ED Provider Notes (Signed)
  [facility] the [name]. Patient originally a medical patient and is now a psych patient- im waiting to speak with nephrology to arrange dialysis as pt will be inpatient psych.   T/TH/Sat  AV fistula placement [[id number]]  02/01/2012 Procedure: ARTERIOVENOUS (AV) FISTULA CREATION; Surgeon: [name], [name];  [facility]; Service:  Vascular; Laterality: Left;   He did go this Tuesday but skipped today. Dialyzes in [place].  5:20 pm Dr. August Saucerean with nephrology has agreed to dialyze the patient. He will see him tonight or tomorrow. I made him aware that the patient is wanting to quit dialysis because he has difficult IV access - Nephrologist will discuss Port insertion with patient.  Psych screening labs ordered. Pt aware of plan as well as family members. TTS aware of plan as well.  Filed Vitals:   07/16/14 1445  BP: 151/71  Pulse: 86  Temp:   Resp: [address]     [name], [name] 07/16/14 1725  [name], [name] 07/16/14 [phone]

## 2014-07-16 NOTE — ED Notes (Signed)
Renal heart healthy diet ordered for patient

## 2014-07-16 NOTE — ED Notes (Signed)
Pt sts here to have hemodialysis cath placed; pt sts last dialysis was Tuesday and "I am tired of them sticking my arm"; pt denies other complaint

## 2014-07-16 NOTE — ED Notes (Signed)
Spoke with Marlon Peliffany Greene PA-C about pt disposition, states pt will go for dialysis while in the ED per nephrologist then place for psychiatric care. Nephrologist will see the pt tonight or tomorrow. Pt last dialysis was Tuesday 07/14/2014. Weekly dialysis on Tuesday/Thursday/Staturday.

## 2014-07-16 NOTE — ED Notes (Signed)
Belongings bagged and labeled at bedside. Security paged to wand pt

## 2014-07-16 NOTE — BH Assessment (Signed)
[facility] Assessment Progress Note   Received order for tele assessment.  Called pt's nurse, Duwayne Heckanielle, to schedule pt's tele assessment.  Called Junius FinnerErin O'Malley, PA-C to gather clinical information on the pt @ 1450.  Casimer LaniusKristen Alisha Bacus, MS, [facility] Licensed Professional Counselor Therapeutic Triage Specialist [facility] Phone: [phone] Fax: [phone]

## 2014-07-16 NOTE — BH Assessment (Addendum)
Tele Assessment Note   [name] is an 79 y.o. male that was assessed this day via tele assessment. Pt presents "with hx of CHF, asthma, OA, COPD, CAD, sleep apnea, and type II IDDM, hypothyroidism, depression, HTN, and CKD stage III on dialysis Tues/Thurs/Sat, presenting to ED as advised by Dr. Willey BladeEric Dean, pt's former PCP. Pt has not f/u with Dr. August Saucerean for at least 1 year due to pt living in [place]. Pt does not recall name of his current PCP. Per Dr. August Saucerean, pt's family called him with concern pt has been skipping dialysis due to not liking to be stuck, stating he did not want to live anymore. Dr. August Saucerean is requesting pt be evaluated by vascular surgery to see if his fistula is adequate enough or if additional access needs to be obtained. Dr. August Saucerean also requests pt be evaluated by psychiatry while admitted due to his severe depression. Pt reports going to dialysis on Tuesday, 07/14/14. States he skipped it today because he does not want to be stuck anymore in his arm. Denies chest pain, SOB, abdominal pain, n/v/d. Pt is c/o bilateral buttock pain. Pt denies SI as he states he would go to dialysis if they did not have to stick him," per note by Junius FinnerErin O'Malley, PA-C at  [facility].  Upon assessment of pt, he denies any SI or HI.  He denies psychosis and no delusions noted.  Pt denies use of alcohol or drugs.  Pt denies any past attempts to harm himself.  Pt stated, "I have a good life.  I just don't like getting stuck in my arm."  Pt stated he would take dialysis if he could get a port "so it wouldn't hurt so bad."  Pt stated his only psychiatric hx was seeing a counselor a "few years ago."  Pt stated he has friends and neighbors here but that his [place] [address] [place] and [place].  Pt calm, cooperative, alert and oriented x 4, has logical/coherent thought processes, euthymic mood and appropriate affect.  Consulted with Constance HawJohn Withrow, NP who discussed case with EDP Radford PaxBeaton, and all in agreement  that pt doesn't meet commitment criteria at this time, that he has the legal right to refuse his medications or medical treatment.  Pt agrees to take dialysis with a port.  Per EDP Beaton's request, called pt's daughter,  Hilda LiasSmithson,Diane Daughter [phone]  And spoke with her at length regarding her father (from [phone]) and she stated that pt has not been taking any of his medications, including his antidepressant.  Pt's daughter stated that he tried this once before and almost died in October due to not taking his dialysis or medications.  [facility] to [facility] per daughter.  Pt's daughter very concerned as pt also told her he wanted to die.  Consulted with EDP Radford PaxBeaton and he and NP Withrow also consulted.  It is agreed that inpatient treatment can be recommended for the pt at this time.  However, pt is not committable.  Talked with pt on the phone and he consented to inpatient psychiatric hospitalization.  Updated EDP Radford PaxBeaton and TTS staff.  Pt's daughter has asked to be updated on pt's disposition.  TTS to seek placement for the pt.   Axis I: 294.8 Other specified mental disorder due to another medical condition Axis II: Deferred Axis III:  Past Medical History  Diagnosis Date  . CHF (congestive heart failure)   . Asthma   . Osteoarthrosis, unspecified  whether generalized or localized, unspecified site   . Obesity, unspecified   . Osteoarthrosis, unspecified whether generalized or localized, unspecified site   . Osteoporosis   . Irregular heart beat   . Sleep-related hypoventilation   . Blood transfusion   . COPD (chronic obstructive pulmonary disease)   . Heart attack   . CAD (coronary artery disease)   . OSA (obstructive sleep apnea)     ??  settings are 13  . Diabetes mellitus     dx 5-6 yrs ago  . Hypothyroidism   . Depression   . Hypertension   . Chronic kidney disease, stage III (moderate)    Axis IV: other psychosocial or environmental problems Axis V: 51-60 moderate  symptoms  Past Medical History:  Past Medical History  Diagnosis Date  . CHF (congestive heart failure)   . Asthma   . Osteoarthrosis, unspecified whether generalized or localized, unspecified site   . Obesity, unspecified   . Osteoarthrosis, unspecified whether generalized or localized, unspecified site   . Osteoporosis   . Irregular heart beat   . Sleep-related hypoventilation   . Blood transfusion   . COPD (chronic obstructive pulmonary disease)   . Heart attack   . CAD (coronary artery disease)   . OSA (obstructive sleep apnea)     ??  settings are 13  . Diabetes mellitus     dx 5-6 yrs ago  . Hypothyroidism   . Depression   . Hypertension   . Chronic kidney disease, stage III (moderate)     Past Surgical History  Procedure Laterality Date  . Knee surgery    . Back surgery    . Cardiac surgery      5 bipasses, carotid artery  . Rectal abscess    . Coronary artery bypass graft    . Eye surgery      left eye  . Joint replacement      bil knees  . Carotid endarterectomy      left  . Av fistula placement  02/01/2012    Procedure: ARTERIOVENOUS (AV) FISTULA CREATION;  Surgeon: Austin Hint, MD;  Location: [facility] OR;  Service: Vascular;  Laterality: Left;    Family History:  Family History  Problem Relation Age of Onset  . Heart disease Mother   . Hyperlipidemia Mother   . Hypertension Mother   . Heart disease Father   . Heart attack Father     Social History:  reports that he has never smoked. He has never used smokeless tobacco. He reports that he does not drink alcohol or use illicit drugs.  Additional Social History:  Alcohol / Drug Use Pain Medications: see med list Prescriptions: see med list Over the Counter: see med list History of alcohol / drug use?: No history of alcohol / drug abuse (drinks alcohol occ) Longest period of sobriety (when/how long):  (na) Negative Consequences of Use:  (na) Withdrawal Symptoms:  (na)  CIWA: CIWA-Ar BP:  151/71 mmHg Pulse Rate: 86 COWS:    PATIENT STRENGTHS: (choose at least two) Ability for insight Average or above average intelligence Capable of independent living Communication skills General fund of knowledge Supportive family/friends  Allergies:  Allergies  Allergen Reactions  . Sulfa Antibiotics     Information from Childhood.  He didn't know reaction    Home Medications:  (Not in a hospital admission)  OB/GYN Status:  No LMP for male patient.  General Assessment Data Location of Assessment: [facility] ED Is this a  Tele or Face-to-Face Assessment?: Tele Assessment Is this an Initial Assessment or a Re-assessment for this encounter?: Initial Assessment Living Arrangements: Alone Can pt return to current living arrangement?: Yes Admission Status: Voluntary Is patient capable of signing voluntary admission?: Yes Transfer from: Acute Hospital Referral Source: MD     [facility] Crisis Care Plan Living Arrangements: Alone Name of Psychiatrist: none Name of Therapist: none  Education Status Is patient currently in school?: No Current Grade: na Highest grade of school patient has completed: College  Risk to self with the past 6 months Suicidal Ideation: No Suicidal Intent: No Is patient at risk for suicide?: No Suicidal Plan?: No Access to Means: No What has been your use of drugs/alcohol within the last 12 months?: na - pt denies Previous Attempts/Gestures: No How many times?: 0 Other Self Harm Risks: na - pt denies Triggers for Past Attempts: None known Intentional Self Injurious Behavior: None Family Suicide History: No Recent stressful life event(s): Other (Comment) (Dialysis) Persecutory voices/beliefs?: No Depression: No Depression Symptoms:  (pt denies) Substance abuse history and/or treatment for substance abuse?: No Suicide prevention information given to non-admitted patients: Not applicable  Risk to Others within the past 6 months Homicidal Ideation:  No Thoughts of Harm to Others: No Current Homicidal Intent: No Current Homicidal Plan: No Access to Homicidal Means: No Identified Victim: na - pt denies History of harm to others?: No Assessment of Violence: None Noted Violent Behavior Description: na - pt calm, cooperative Does patient have access to weapons?: No Criminal Charges Pending?: No Does patient have a court date: No  Psychosis Hallucinations: None noted Delusions: None noted  Mental Status Report Appear/Hygiene: Unremarkable Eye Contact: Good Motor Activity: Freedom of movement, Unremarkable Speech: Logical/coherent Level of Consciousness: Alert Mood: Euthymic, Pleasant Affect: Appropriate to circumstance Anxiety Level: None Thought Processes: Coherent, Relevant Judgement: Unimpaired Orientation: Person, Place, Time, Situation Obsessive Compulsive Thoughts/Behaviors: None  Cognitive Functioning Concentration: Normal Memory: Recent Intact, Remote Intact IQ: Average Insight: Fair Impulse Control: Fair Appetite: Fair Weight Loss:  (reports has lost a lot of weight since starting dialysis) Weight Gain: 0 Sleep: No Change Total Hours of Sleep:  (reports it varies) Vegetative Symptoms: None  ADLScreening [facility] Assessment Services) Patient's cognitive ability adequate to safely complete daily activities?: Yes Patient able to express need for assistance with ADLs?: Yes Independently performs ADLs?: Yes (appropriate for developmental age)  Prior Inpatient Therapy Prior Inpatient Therapy: No Prior Therapy Dates: na Prior Therapy Facilty/Provider(s): na Reason for Treatment: na  Prior Outpatient Therapy Prior Outpatient Therapy: Yes Prior Therapy Dates: "a few years ago" Prior Therapy Facilty/Provider(s): [facility] outpatient clinic Reason for Treatment: therapy  ADL Screening (condition at time of admission) Patient's cognitive ability adequate to safely complete daily activities?: Yes Is the patient deaf or  have difficulty hearing?: No Does the patient have difficulty seeing, even when wearing glasses/contacts?: No Does the patient have difficulty concentrating, remembering, or making decisions?: No Patient able to express need for assistance with ADLs?: Yes Does the patient have difficulty dressing or bathing?: No Independently performs ADLs?: Yes (appropriate for developmental age) Does the patient have difficulty walking or climbing stairs?: No  Home Assistive Devices/Equipment Home Assistive Devices/Equipment: None    Abuse/Neglect Assessment (Assessment to be complete while patient is alone) Physical Abuse: Denies Verbal Abuse: Denies Sexual Abuse: Denies Exploitation of patient/patient's resources: Denies Self-Neglect: Denies Values / Beliefs Cultural Requests During Hospitalization: None Spiritual Requests During Hospitalization: None Consults Spiritual Care Consult Needed: No Social Work Consult Needed:  No Advance Directives (For Healthcare) Does patient have an advance directive?: No Would patient like information on creating an advanced directive?: No - patient declined information    Additional Information 1:1 In Past 12 Months?: No CIRT Risk: No Elopement Risk: No Does patient have medical clearance?: Yes     Disposition:  Disposition Initial Assessment Completed for this Encounter: Yes Disposition of Patient: Other dispositions Other disposition(s): Other (Comment) (Pt to be discharged and offered outpt referrals)  Casimer Lanius, MS, [facility] Licensed Professional Counselor Therapeutic Triage Specialist [facility] Phone: [phone] Fax: [phone]  07/16/2014 3:30 PM

## 2014-07-16 NOTE — ED Notes (Signed)
[name], with [facility] called and is doing TTS.

## 2014-07-17 DIAGNOSIS — T82898A Other specified complication of vascular prosthetic devices, implants and grafts, initial encounter: Secondary | ICD-10-CM | POA: Diagnosis not present

## 2014-07-17 LAB — URINALYSIS, ROUTINE W REFLEX MICROSCOPIC
Bilirubin Urine: NEGATIVE
GLUCOSE, UA: NEGATIVE mg/dL
KETONES UR: NEGATIVE mg/dL
NITRITE: NEGATIVE
Protein, ur: 100 mg/dL — AB
SPECIFIC GRAVITY, URINE: 1.02 (ref 1.005–1.030)
UROBILINOGEN UA: 0.2 mg/dL (ref 0.0–1.0)
pH: 7.5 (ref 5.0–8.0)

## 2014-07-17 LAB — RAPID URINE DRUG SCREEN, HOSP PERFORMED
AMPHETAMINES: NOT DETECTED
BARBITURATES: NOT DETECTED
Benzodiazepines: NOT DETECTED
Cocaine: NOT DETECTED
Opiates: NOT DETECTED
Tetrahydrocannabinol: NOT DETECTED

## 2014-07-17 LAB — BASIC METABOLIC PANEL
ANION GAP: 20 — AB (ref 5–15)
BUN: 105 mg/dL — AB (ref 6–23)
CHLORIDE: 95 mmol/L — AB (ref 96–112)
CO2: 20 mmol/L (ref 19–32)
Calcium: 6.7 mg/dL — ABNORMAL LOW (ref 8.4–10.5)
Creatinine, Ser: 12.76 mg/dL — ABNORMAL HIGH (ref 0.50–1.35)
GFR, EST AFRICAN AMERICAN: 4 mL/min — AB (ref >90)
GFR, EST NON AFRICAN AMERICAN: 3 mL/min — AB (ref >90)
Glucose, Bld: 110 mg/dL — ABNORMAL HIGH (ref 70–99)
Potassium: 5.4 mmol/L — ABNORMAL HIGH (ref 3.5–5.1)
Sodium: 135 mmol/L (ref 135–145)

## 2014-07-17 LAB — CBG MONITORING, ED
GLUCOSE-CAPILLARY: 91 mg/dL (ref 70–99)
GLUCOSE-CAPILLARY: 79 mg/dL (ref 70–99)
Glucose-Capillary: 59 mg/dL — ABNORMAL LOW (ref 70–99)

## 2014-07-17 LAB — URINE MICROSCOPIC-ADD ON

## 2014-07-17 MED ORDER — ALBUTEROL SULFATE HFA 108 (90 BASE) MCG/ACT IN AERS: 2.0000 | INHALATION_SPRAY | Freq: Four times a day (QID) | RESPIRATORY_TRACT | Status: DC | PRN

## 2014-07-17 MED ORDER — AMLODIPINE BESYLATE 5 MG PO TABS
5.0000 mg | ORAL_TABLET | Freq: Every day | ORAL | Status: DC
  Administered 2014-07-17: 5 mg via ORAL
  Filled 2014-07-17: qty 1

## 2014-07-17 MED ORDER — LIDOCAINE-EPINEPHRINE-TETRACAINE (LET) SOLUTION
3.0000 mL | Freq: Once | NASAL | Status: AC
  Administered 2014-07-17: 3 mL via TOPICAL
  Filled 2014-07-17: qty 3

## 2014-07-17 MED ORDER — INSULIN ASPART 100 UNIT/ML ~~LOC~~ SOLN: 0.0000 [IU] | Freq: Three times a day (TID) | SUBCUTANEOUS | Status: DC

## 2014-07-17 MED ORDER — RAMIPRIL 10 MG PO CAPS
10.0000 mg | ORAL_CAPSULE | Freq: Two times a day (BID) | ORAL | Status: DC
  Administered 2014-07-17: 10 mg via ORAL
  Filled 2014-07-17 (×2): qty 1

## 2014-07-17 MED ORDER — GLIPIZIDE 5 MG PO TABS
5.0000 mg | ORAL_TABLET | Freq: Every day | ORAL | Status: DC
  Administered 2014-07-17: 5 mg via ORAL
  Filled 2014-07-17 (×2): qty 1

## 2014-07-17 MED ORDER — LINAGLIPTIN 5 MG PO TABS
5.0000 mg | ORAL_TABLET | Freq: Every day | ORAL | Status: DC
  Filled 2014-07-17: qty 1

## 2014-07-17 MED ORDER — TAMSULOSIN HCL 0.4 MG PO CAPS
0.4000 mg | ORAL_CAPSULE | Freq: Every day | ORAL | Status: DC
  Administered 2014-07-17: 0.4 mg via ORAL
  Filled 2014-07-17: qty 1

## 2014-07-17 MED ORDER — CARVEDILOL 25 MG PO TABS
25.0000 mg | ORAL_TABLET | Freq: Two times a day (BID) | ORAL | Status: DC
  Administered 2014-07-17: 25 mg via ORAL
  Filled 2014-07-17 (×3): qty 1

## 2014-07-17 MED ORDER — INSULIN GLARGINE 100 UNIT/ML ~~LOC~~ SOLN
20.0000 [IU] | Freq: Two times a day (BID) | SUBCUTANEOUS | Status: DC
  Administered 2014-07-17: 20 [IU] via SUBCUTANEOUS
  Filled 2014-07-17 (×2): qty 0.2

## 2014-07-17 MED ORDER — FUROSEMIDE 20 MG PO TABS
80.0000 mg | ORAL_TABLET | Freq: Two times a day (BID) | ORAL | Status: DC
  Administered 2014-07-17: 80 mg via ORAL
  Filled 2014-07-17: qty 4

## 2014-07-17 MED ORDER — ASPIRIN EC 81 MG PO TBEC
81.0000 mg | DELAYED_RELEASE_TABLET | Freq: Every day | ORAL | Status: DC
  Administered 2014-07-17: 81 mg via ORAL
  Filled 2014-07-17: qty 1

## 2014-07-17 MED ORDER — LINAGLIPTIN 5 MG PO TABS
5.0000 mg | ORAL_TABLET | Freq: Every day | ORAL | Status: DC
  Administered 2014-07-17: 5 mg via ORAL
  Filled 2014-07-17: qty 1

## 2014-07-17 MED ORDER — BUDESONIDE-FORMOTEROL FUMARATE 160-4.5 MCG/ACT IN AERO
2.0000 | INHALATION_SPRAY | Freq: Two times a day (BID) | RESPIRATORY_TRACT | Status: DC
  Administered 2014-07-17: 2 via RESPIRATORY_TRACT
  Filled 2014-07-17: qty 6

## 2014-07-17 MED ORDER — INSULIN ASPART 100 UNIT/ML ~~LOC~~ SOLN
0.0000 [IU] | Freq: Three times a day (TID) | SUBCUTANEOUS | Status: DC
Start: 2014-07-17 — End: 2014-07-17

## 2014-07-17 NOTE — Progress Notes (Signed)
Pt referral faxed to the following facilities:  [facility]   Will continue to seek placement.   Chad CordialLauren Carter, LCSWA 07/17/2014 8:29 AM

## 2014-07-17 NOTE — ED Notes (Signed)
CBG taken = 91

## 2014-07-17 NOTE — ED Provider Notes (Signed)
I discussed this patient with the nephrology service.  Dr. Franchot HeidelbergAlan Powell and I discussed options.  At this point in time according to Dr. Lowell GuitarPowell the patient will not qualify for a external port.  He'll need to discuss those issues with his current nephrology person.  We did also contact the internal medicine teaching service about possible admission.  They spoke with [place] dialysis center [place] and were informed that they would be glad to dialyze the patient when he arrived today.  Plan at this time is to place some Emily cream on the patient's forearm and [facility] [place].  Patient is agreeable with this plan.  He will discuss long-term dialysis problems with his current nephrologist in [place].  He is also encouraged to start back on his psychiatric medication.  At the present time patient appears stable for discharge.  His repeat blood work done today shows no significant increase in potassium.  His blood pressure is been stable.  He denies any homicidal or suicidal thoughts or ideations.  He has been seen by psychiatry and internal medicine with consults to nephrology here at [facility].  Nelia Shiobert L Vianney Kopecky, MD 07/17/14 [phone]

## 2014-07-17 NOTE — ED Notes (Signed)
Spoke to dr dean (pt dialysis dr) and he advises he was wanting pt referred to the hospitalist for medical management and vascular surgery consulted for a dialysis port evaluation, and psych consulted for pt depression. He seemed surprised the pt was still in the emergency department. Information passed on to dr Radford Paxbeaton and he is consulting for admission.

## 2014-07-17 NOTE — ED Notes (Signed)
ADMITTING DOCTOR AT BEDSIDE 

## 2014-07-17 NOTE — ED Notes (Signed)
Pt given juice to drink due to CBG 59

## 2014-07-17 NOTE — Progress Notes (Signed)
This NP consulted with Dr. Radford PaxBeaton and TTS staff regarding this pt's case. TTS obtained collateral information from family as well. Per family, objective/subjective assessment, and clinical judgment, pt is not actively suicidal nor has he made attempts or gestures. It is his legal medical right to refuse any treatment and abstaining from dialysis or other medical intervention is his personal decision. Dr. Radford PaxBeaton and this NP in agreement to offer pt treatment, inpatient or outpatient, and to assist pt with this treatment if desired. If pt does not want treatment, he may discharge home as there is no committable criteria. If inpatient is suggested, pt may have a difficult time being accepted due to lack of inpatient criteria. Outpatient follow-up will likely be ideal so that pt can obtain a port within the next 1-2 weeks to continue dialysis in a less painful way, which is reports he is interested in doing.    Beau FannyWITHROW, Ramon Brant C, FNP-BC 07/16/14     03:42PM

## 2014-07-17 NOTE — ED Notes (Signed)
Admitting MD in room.

## 2014-07-17 NOTE — ED Notes (Signed)
Paged dr dean to inquire about pt port placement. Pt states he is supposed to stay in the hospital until he gets his port placed

## 2014-07-17 NOTE — ED Notes (Signed)
SOCIAL WORK HAS BEEN IN TO GIVE PT PSYCH FOLLOW UP RESOURCES CLOSE TO HIS HOME. SHE HAS ALSO SET UP AN APPOINTMENT FOR HIM TO GO TO .

## 2014-07-17 NOTE — ED Notes (Signed)
Urine sample found in pt room. States that he collected that last night and the nurse didn't get it. Discarded sample and fresh cup given to patient. He verbalizes he will try to collect a sample

## 2014-07-17 NOTE — Discharge Instructions (Signed)
Please contact you dialysis doctor as soon as possible and disucss options for placing a port.   Chronic Kidney Disease Chronic kidney disease occurs when the kidneys are damaged over a long period. The kidneys are two organs that lie on either side of the spine between the middle of the back and the front of the abdomen. The kidneys:   Remove wastes and extra water from the blood.   Produce important hormones. These help keep bones strong, regulate blood pressure, and help create red blood cells.   Balance the fluids and chemicals in the blood and tissues. A small amount of kidney damage may not cause problems, but a large amount of damage may make it difficult or impossible for the kidneys to work the way they should. If steps are not taken to slow down the kidney damage or stop it from getting worse, the kidneys may stop working permanently. Most of the time, chronic kidney disease does not go away. However, it can often be controlled, and those with the disease can usually live normal lives. CAUSES  The most common causes of chronic kidney disease are diabetes and high blood pressure (hypertension). Chronic kidney disease may also be caused by:   Diseases that cause the kidneys' filters to become inflamed.   Diseases that affect the immune system.   Genetic diseases.   Medicines that damage the kidneys, such as anti-inflammatory medicines.  Poisoning or exposure to toxic substances.   A reoccurring kidney or urinary infection.   A problem with urine flow. This may be caused by:   Cancer.   Kidney stones.   An enlarged prostate in males. SIGNS AND SYMPTOMS  Because the kidney damage in chronic kidney disease occurs slowly, symptoms develop slowly and may not be obvious until the kidney damage becomes severe. A person may have a kidney disease for years without showing any symptoms. Symptoms can include:   Swelling (edema) of the legs, ankles, or feet.    Tiredness (lethargy).   Nausea or vomiting.   Confusion.   Problems with urination, such as:   Decreased urine production.   Frequent urination, especially at night.   Frequent accidents in children who are potty trained.   Muscle twitches and cramps.   Shortness of breath.  Weakness.   Persistent itchiness.   Loss of appetite.  Metallic taste in the mouth.  Trouble sleeping.  Slowed development in children.  Short stature in children. DIAGNOSIS  Chronic kidney disease may be detected and diagnosed by tests, including blood, urine, imaging, or kidney biopsy tests.  TREATMENT  Most chronic kidney diseases cannot be cured. Treatment usually involves relieving symptoms and preventing or slowing the progression of the disease. Treatment may include:   A special diet. You may need to avoid alcohol and foods thatare salty and high in potassium.   Medicines. These may:   Lower blood pressure.   Relieve anemia.   Relieve swelling.   Protect the bones. HOME CARE INSTRUCTIONS   Follow your prescribed diet.   Take medicines only as directed by your health care provider. Do not take any new medicines (prescription, over-the-counter, or nutritional supplements) unless approved by your health care provider. Many medicines can worsen your kidney damage or need to have the dose adjusted.   Quit smoking if you smoke. Talk to your health care provider about a smoking cessation program.   Keep all follow-up visits as directed by your health care provider. SEEK IMMEDIATE MEDICAL CARE IF:  Your  symptoms get worse or you develop new symptoms.   You develop symptoms of end-stage kidney disease. These include:   Headaches.   Abnormally dark or light skin.   Numbness in the hands or feet.   Easy bruising.   Frequent hiccups.   Menstruation stops.   You have a fever.   You have decreased urine production.   You havepain or  bleeding when urinating. MAKE SURE YOU:  Understand these instructions.  Will watch your condition.  Will get help right away if you are not doing well or get worse. FOR MORE INFORMATION   American Association of Kidney Patients: ResidentialShow.iswww.aakp.org  National Kidney Foundation: www.kidney.org  American Kidney Fund: FightingMatch.com.eewww.akfinc.org  Life Options Rehabilitation Program: www.lifeoptions.org and www.kidneyschool.org Document Released: 03/07/2008 Document Revised: 10/13/2013 Document Reviewed: 01/26/2012 [facility] Patient Information 2015 [employer] [place]. This information is not intended to replace advice given to you by your health care provider. Make sure you discuss any questions you have with your health care provider.

## 2014-07-17 NOTE — Progress Notes (Signed)
This NP spoke to TTS staff who requested clarification of patient's status and recommendations. See copied/pasted note from this NP below:   This NP consulted with Dr. Radford PaxBeaton and TTS staff regarding this pt's case. TTS obtained collateral information from family as well. Per family, objective/subjective assessment, and clinical judgment, pt is not actively suicidal nor has he made attempts or gestures. It is his legal medical right to refuse any treatment and abstaining from dialysis or other medical intervention is his personal decision. Dr. Radford PaxBeaton and this NP in agreement to offer pt treatment, inpatient or outpatient, and to assist pt with this treatment if desired. If pt does not want treatment, he may discharge home as there is no committable criteria. If inpatient is suggested, pt may have a difficult time being accepted due to lack of inpatient criteria. Outpatient follow-up will likely be ideal so that pt can obtain a port within the next 1-2 weeks to continue dialysis in a less painful way, which is reports he is interested in doing.    Beau FannyWITHROW, Tanya Marvin C, FNP-BC 07/16/14     03:42PM    Beau FannyWITHROW, Korver Graybeal C, FNP-BC 07/17/14     10:04 AM

## 2014-07-17 NOTE — Consult Note (Signed)
Date: 07/17/2014               Patient Name:  Austin DalesJerry W Adams MRN: [id number]  DOB: 09/24/1935 Age / Sex: 79 y.o., male   PCP: Gwenyth BenderEric L Dean, MD         Requesting Physician: Dr. Nelva Nayobert Beaton    Consulting Reason:  Pt declining dialysis     Chief Complaint: Pt does not want dialysis due to pain with dialysis needle insertion.   History of Present Illness: 79yo M w/ PMH DM2, CHF, COPD, and ESRD on HD presents to the ED per request of a family friend who is an MD b/c the patient no longer wanted to undergo dialysis.  Pt lives in [place] but is family friends with community physician Dr. Willey BladeEric Dean. The patient reportedly has been feeling depressed due to all of his health issues and wanted to stop getting dialysis via his LUE shunt due to pain with needle insertion. This was apparently reported to Dr. August Saucerean, who recommended that the patient come to the ED for further evaluation for his depression and to be assessed for an alternative to his current HD situation. Per ED physician, port placement for dialysis was discussed with the patient and Dr. August Saucerean as an alternative to using his shunt, and the patient was amenable to this change. In the ED, the patient was not suicidal nor had he had any SI and states that his mood had been really good lately, but was evaluated by Psych for his depression and was felt that outpatient evaluation would be beneficial.   Of note, the patient receives dialysis [facility] and missed his session yesterday. K has been stable at 5.4, no EKG was obtained. Pt w/o chest pain, SOB, nausea, or peripheral edema. Nephrology was consulted and felt that the patient should undergo dialysis, which could be done at his HD center if they could take him today. His dialysis center was contacted and is able to take the patient today given that he missed his HD yesterday.   Meds: Current Facility-Administered Medications  Medication Dose Route Frequency Provider Last Rate Last Dose  . albuterol  (PROVENTIL HFA;VENTOLIN HFA) 108 (90 BASE) MCG/ACT inhaler 2 puff  2 puff Inhalation Q6H PRN Hilario Quarryanielle S Ray, MD      . alum & mag hydroxide-simeth (MAALOX/MYLANTA) 200-200-20 MG/5ML suspension 30 mL  30 mL Oral PRN Junius FinnerErin O'Malley, PA-C      . amLODipine (NORVASC) tablet 5 mg  5 mg Oral Daily Hilario Quarryanielle S Ray, MD   5 mg at 07/17/14 0959  . aspirin EC tablet 81 mg  81 mg Oral Daily Hilario Quarryanielle S Ray, MD   81 mg at 07/17/14 [id number]  . budesonide-formoterol (SYMBICORT) 160-4.5 MCG/ACT inhaler 2 puff  2 puff Inhalation BID Hilario Quarryanielle S Ray, MD   2 puff at 07/17/14 [phone]  . carvedilol (COREG) tablet 25 mg  25 mg Oral BID WC Hilario Quarryanielle S Ray, MD   25 mg at 07/17/14 0919  . furosemide (LASIX) tablet 80 mg  80 mg Oral BID Hilario Quarryanielle S Ray, MD   80 mg at 07/17/14 [id number]  . glipiZIDE (GLUCOTROL) tablet 5 mg  5 mg Oral Q breakfast Hilario Quarryanielle S Ray, MD   5 mg at 07/17/14 0919  . insulin aspart (novoLOG) injection 0-15 Units  0-15 Units Subcutaneous TID WC Hilario Quarryanielle S Ray, MD   0 Units at 07/17/14 1134  . insulin glargine (LANTUS) injection 20 Units  20 Units Subcutaneous BID Hilario Quarryanielle S Ray, MD  20 Units at 07/17/14 0955  . lidocaine-EPINEPHrine-tetracaine (LET) topical gel  3 mL Topical Once Nelia Shi, MD      . linagliptin (TRADJENTA) tablet 5 mg  5 mg Oral Daily Hilario Quarry, MD   5 mg at 07/17/14 0957  . ondansetron (ZOFRAN) tablet 4 mg  4 mg Oral Q8H PRN Junius Finner, PA-C      . ramipril (ALTACE) capsule 10 mg  10 mg Oral BID Hilario Quarry, MD   10 mg at 07/17/14 0958  . tamsulosin (FLOMAX) capsule 0.4 mg  0.4 mg Oral Daily Hilario Quarry, MD   0.4 mg at 07/17/14 1005   Current Outpatient Prescriptions  Medication Sig Dispense Refill  . albuterol (PROVENTIL HFA;VENTOLIN HFA) 108 (90 BASE) MCG/ACT inhaler Inhale 2 puffs into the lungs every 6 (six) hours as needed. For shortness of breath.    Marland Kitchen amLODipine (NORVASC) 5 MG tablet Take 5 mg by mouth daily.    Marland Kitchen aspirin EC 81 MG tablet Take 81 mg by mouth daily.    .  budesonide-formoterol (SYMBICORT) 160-4.5 MCG/ACT inhaler Inhale 2 puffs into the lungs 2 (two) times daily.    . carvedilol (COREG) 25 MG tablet Take 25 mg by mouth 2 (two) times daily with a meal.    . Fluticasone-Salmeterol (ADVAIR) 100-50 MCG/DOSE AEPB Inhale 1 puff into the lungs every 12 (twelve) hours.    . furosemide (LASIX) 40 MG tablet Take 80 mg by mouth 2 (two) times daily.    Marland Kitchen glipiZIDE (GLUCOTROL) 5 MG tablet Take 5 mg by mouth daily.    . insulin aspart (NOVOLOG) 100 UNIT/ML injection Inject 0-15 Units into the skin 3 (three) times daily before meals. Per sliding scale    . insulin glargine (LANTUS) 100 UNIT/ML injection Inject 20 Units into the skin 2 (two) times daily.     Marland Kitchen levothyroxine (SYNTHROID, LEVOTHROID) 50 MCG tablet Take 50 mcg by mouth daily.    Marland Kitchen linagliptin (TRADJENTA) 5 MG TABS tablet Take 1 tablet (5 mg total) by mouth daily. 30 tablet 3  . lovastatin (MEVACOR) 40 MG tablet Take 80 mg by mouth 2 (two) times daily.     . ramipril (ALTACE) 10 MG capsule Take 10 mg by mouth 2 (two) times daily.    . sitaGLIPtin (JANUVIA) 100 MG tablet Take 100 mg by mouth daily.    . Tamsulosin HCl (FLOMAX) 0.4 MG CAPS Take 0.4 mg by mouth daily.    Marland Kitchen albuterol (PROVENTIL) (5 MG/ML) 0.5% nebulizer solution Take 0.5 mLs (2.5 mg total) by nebulization every 4 (four) hours as needed for wheezing or shortness of breath. 60 mL 6    Allergies: Allergies as of 07/16/2014 - Review Complete 07/16/2014  Allergen Reaction Noted  . Sulfa antibiotics  09/22/2011   Past Medical History  Diagnosis Date  . CHF (congestive heart failure)   . Asthma   . Osteoarthrosis, unspecified whether generalized or localized, unspecified site   . Obesity, unspecified   . Osteoarthrosis, unspecified whether generalized or localized, unspecified site   . Osteoporosis   . Irregular heart beat   . Sleep-related hypoventilation   . Blood transfusion   . COPD (chronic obstructive pulmonary disease)   .  Heart attack   . CAD (coronary artery disease)   . OSA (obstructive sleep apnea)     ??  settings are 13  . Diabetes mellitus     dx 5-6 yrs ago  . Hypothyroidism   . Depression   .  Hypertension   . Chronic kidney disease, stage III (moderate)    Past Surgical History  Procedure Laterality Date  . Knee surgery    . Back surgery    . Cardiac surgery      5 bipasses, carotid artery  . Rectal abscess    . Coronary artery bypass graft    . Eye surgery      left eye  . Joint replacement      bil knees  . Carotid endarterectomy      left  . Av fistula placement  02/01/2012    Procedure: ARTERIOVENOUS (AV) FISTULA CREATION;  Surgeon: Chuck Hint, MD;  Location: [facility] OR;  Service: Vascular;  Laterality: Left;   Family History  Problem Relation Age of Onset  . Heart disease Mother   . Hyperlipidemia Mother   . Hypertension Mother   . Heart disease Father   . Heart attack Father    History   Social History  . Marital Status: Divorced    Spouse Name: N/A    Number of Children: N/A  . Years of Education: N/A   Occupational History  . Not on file.   Social History Main Topics  . Smoking status: Never Smoker   . Smokeless tobacco: Never Used  . Alcohol Use: No     Comment: 2 times a year  . Drug Use: No  . Sexual Activity: Not on file   Other Topics Concern  . Not on file   Social History Narrative    Review of Systems: Review of Systems  Constitutional: Denies fever, chills, appetite change or fatigue.  HEENT: Vision loss.  Respiratory: Denies SOB, DOE, chest tightness, or wheezing.  Cardiovascular: Denies chest pain, palpitations or leg swelling.  Gastrointestinal: Denies nausea, vomiting, abdominal pain, diarrhea, constipation. Genitourinary: Denies dysuria.  Musculoskeletal: Denies myalgias, back pain, or arthralgias. Ambulates with a walker.  Skin: Denies skin changes or wound.  Neurological: Denies dizziness, seizures, syncope, weakness, numbness  or headaches.  Psychiatric/Behavioral: Denies suicidal ideation, mood changes, confusion, or agitation    Physical Exam: Blood pressure 124/68, pulse 88, temperature 97.4 F (36.3 C), temperature source Oral, resp. rate 18, SpO2 100 %. General: Morbidly obese male, who is in no acute distress, and cooperative on examination.  Head: Normocephalic and atraumatic.  Eyes: EOMI, pupils equal, round, and reactive to light.  Mouth: Pharynx pink  Neck: Supple, full ROM, no JVD.  Lungs: CTAB, normal respiratory effort, no accessory muscle use, no crackles, and no wheezes. Heart: Regular rate, regular rhythm.  Abdomen: Soft, non-tender, non-distended, normal bowel sounds, no guarding, no rebound tenderness, no organomegaly.  Msk: No warmth or erythema.  Extremities: No edema, LUE with AV shunt with good thrill. Neurologic: Alert & oriented X3, cranial nerves II-XII intact, strength normal in all extremities Skin: Left forearm with darkened discoloration, skin dry  Psych: Normal mood and affect, normally interactive, good eye contact, not anxious appearing, and not depressed appearing.    Lab results: Basic Metabolic Panel:  Recent Labs  16/10/96 1214 07/17/14 1013  NA 137 135  K 5.4* 5.4*  CL 96 95*  CO2 22 20  GLUCOSE 136* 110*  BUN 88* 105*  CREATININE 11.59* 12.76*  CALCIUM 6.9* 6.7*   Liver Function Tests:  Recent Labs  07/16/14 1214  AST 8  ALT 14  ALKPHOS 115  BILITOT 0.5  PROT 8.4*  ALBUMIN 3.6   CBC:  Recent Labs  07/16/14 1214  WBC 6.5  NEUTROABS 4.6  HGB 10.6*  HCT 32.6*  MCV 95.9  PLT 156   CBG:  Recent Labs  07/17/14 0700 07/17/14 1129  GLUCAP 91 59*    Alcohol Level:  Recent Labs  07/16/14 1213  ETH <5   Urinalysis: No results for input(s): COLORURINE, LABSPEC, PHURINE, GLUCOSEU, HGBUR, BILIRUBINUR, KETONESUR, PROTEINUR, UROBILINOGEN, NITRITE, LEUKOCYTESUR in the last 72 hours.  Invalid input(s): APPERANCEUR   Imaging results:    No results found.  Other results: EKG: Not performed in ED.  Assessment, Plan, & Recommendations by Problem: 79yo M w/ PMH DM2, CHF, COPD, and ESRD on HD presents to the ED per request of a family friend who is an MD b/c the patient no longer wanted to undergo dialysis.  #ESRD on HD: Pt undergoes HD in [facility] via his LUE shunt. Pt overall compliant with his HD, but did quit going 1 month ago. He restarted HD after becoming ill; however, is now becoming increasingly unhappy with the frequent needle sticks through his LUE shunt. Port placement has been discussed with the patient by a family friend, who is a Database administrator here in [place], Dr. Willey Blade. ED Physician contacted Nephrology on call, Dr. Lowell Guitar, who felt that port placement will need to be discussed with the patient's PCP and nephrologist given this is not an emergent situation. He also recommended that the patient undergo dialysis for his asymptomatic hypokalemia, which could be done at his outpatient dialysis center if he could be seen today. The patient is not volume overloaded on exam and is completely asymptomatic. The dialysis center was contacted and is able to take the patient anytime today. ED physician, Dr. Radford Pax notified. - Pt should report to his outpatient dialysis center today for dialysis, and he should follow up with his PCP and Nephrologist to discuss alternatives to his current dialysis situation.    #Depression: Depression, if present is likely 2/2 chronic medical issues. No current SI or depressive symptoms per pt, except that he is not happy with dialysis and getting stuck so frequently. Psych saw the patient in the ED and felt that outpatient psychiatry would be beneficial, but the pt likely does not qualify for inpatient therapy.  - Pt should f/u with his PCP for any depressive symptoms, but he is currently stable.  Dispo: From a medical standpoint, the patient is stable and does not meet criteria  for admission. He does require dialysis, which should be done today at his outpatient facility.     Signed: Genelle Gather, MD 07/17/2014, 11:55 AM

## 2014-07-17 NOTE — ED Notes (Signed)
Pt verbalized understanding of discharge instructions and the instruction to drive from here straight to dialysis center in [place]. Pt  Has no further questions.

## 2014-07-17 NOTE — Progress Notes (Signed)
LCSW was made aware that patient is being discharged from Emergency Room. Arranged appointment for [facility] health [facility] at the [facility]. Reviewed with patient his appointment and need to bring insurance card.  Faxed over clinicals in effort to establish patient in mental health clinic.   Deretha EmoryHannah Caeleb Batalla LCSW, MSW Clinical Social Work: Emergency Room [phone]

## 2015-12-11 DEATH — deceased
